# Patient Record
Sex: Female | Born: 1990 | Race: Black or African American | Hispanic: No | Marital: Single | Smoking: Never smoker
Health system: Southern US, Community
[De-identification: ages and names within clinical notes are randomized; demographics above are authoritative.]

## PROBLEM LIST (undated history)

## (undated) DIAGNOSIS — F419 Anxiety disorder, unspecified: Secondary | ICD-10-CM

## (undated) DIAGNOSIS — Z789 Other specified health status: Secondary | ICD-10-CM

## (undated) HISTORY — PX: WISDOM TOOTH EXTRACTION: SHX21

## (undated) HISTORY — PX: PILONIDAL CYST DRAINAGE: SHX743

## (undated) HISTORY — DX: Other specified health status: Z78.9

---
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
## 2010-04-27 ENCOUNTER — Emergency Department (HOSPITAL_COMMUNITY)
Admission: EM | Admit: 2010-04-27 | Discharge: 2010-04-27 | Payer: Self-pay | Source: Home / Self Care | Admitting: Emergency Medicine

## 2010-07-08 LAB — BASIC METABOLIC PANEL
BUN: 11 mg/dL (ref 6–23)
CO2: 24 mEq/L (ref 19–32)
Calcium: 9.4 mg/dL (ref 8.4–10.5)
Chloride: 103 mEq/L (ref 96–112)
Creatinine, Ser: 0.65 mg/dL (ref 0.4–1.2)
GFR calc Af Amer: >60 mL/min (ref >60)
GFR calc non Af Amer: >60 mL/min (ref >60)
Glucose, Bld: 114 mg/dL — ABNORMAL HIGH (ref 70–99)
Potassium: 3.5 mEq/L (ref 3.5–5.1)
Sodium: 137 mEq/L (ref 135–145)

## 2010-07-08 LAB — CBC
HCT: 36.2 % (ref 36.0–46.0)
Hemoglobin: 12.6 g/dL (ref 12.0–15.0)
MCH: 32.3 pg (ref 26.0–34.0)
MCHC: 34.8 g/dL (ref 30.0–36.0)
MCV: 92.8 fL (ref 78.0–100.0)
Platelets: 280 10*3/uL (ref 150–400)
RBC: 3.9 MIL/uL (ref 3.87–5.11)
RDW: 11.4 % — ABNORMAL LOW (ref 11.5–15.5)
WBC: 9.9 10*3/uL (ref 4.0–10.5)

## 2010-07-08 LAB — URINALYSIS, ROUTINE W REFLEX MICROSCOPIC
Bilirubin Urine: NEGATIVE
Glucose, UA: NEGATIVE mg/dL
Hgb urine dipstick: NEGATIVE
Ketones, ur: NEGATIVE mg/dL
Nitrite: NEGATIVE
Protein, ur: NEGATIVE mg/dL
Specific Gravity, Urine: 1.02 (ref 1.005–1.030)
Urobilinogen, UA: 0.2 mg/dL (ref 0.0–1.0)
pH: 7 (ref 5.0–8.0)

## 2010-07-08 LAB — HEPATIC FUNCTION PANEL
ALT: 27 U/L (ref 0–35)
AST: 33 U/L (ref 0–37)
Albumin: 4.4 g/dL (ref 3.5–5.2)
Alkaline Phosphatase: 42 U/L (ref 39–117)
Bilirubin, Direct: 0.2 mg/dL (ref 0.0–0.3)
Indirect Bilirubin: 0.6 mg/dL (ref 0.3–0.9)
Total Bilirubin: 0.8 mg/dL (ref 0.3–1.2)
Total Protein: 7.5 g/dL (ref 6.0–8.3)

## 2010-07-08 LAB — PREGNANCY, URINE: Preg Test, Ur: NEGATIVE

## 2010-07-08 LAB — DIFFERENTIAL
Basophils Absolute: 0 10*3/uL (ref 0.0–0.1)
Basophils Relative: 0 % (ref 0–1)
Eosinophils Relative: 0 % (ref 0–5)
Monocytes Absolute: 0.4 10*3/uL (ref 0.1–1.0)
Neutro Abs: 9 10*3/uL — ABNORMAL HIGH (ref 1.7–7.7)

## 2015-01-09 ENCOUNTER — Encounter (HOSPITAL_COMMUNITY): Payer: Self-pay | Admitting: Emergency Medicine

## 2015-01-09 ENCOUNTER — Emergency Department (HOSPITAL_COMMUNITY)
Admission: EM | Admit: 2015-01-09 | Discharge: 2015-01-09 | Disposition: A | Discharge status: Home / Self Care | Payer: Self-pay | Attending: Emergency Medicine | Admitting: Emergency Medicine

## 2015-01-09 ENCOUNTER — Emergency Department (HOSPITAL_COMMUNITY): Payer: Self-pay

## 2015-01-09 DIAGNOSIS — R091 Pleurisy: Secondary | ICD-10-CM | POA: Insufficient documentation

## 2015-01-09 MED ORDER — NAPROXEN 500 MG PO TABS: 500.0000 mg | ORAL_TABLET | Freq: Two times a day (BID) | ORAL | Status: DC

## 2015-01-09 MED ORDER — IBUPROFEN 800 MG PO TABS
800.0000 mg | ORAL_TABLET | Freq: Once | ORAL | Status: AC
  Administered 2015-01-09: 800 mg via ORAL
  Filled 2015-01-09: qty 1

## 2015-01-09 NOTE — ED Provider Notes (Signed)
CSN: [id number]     Arrival date & time 01/09/15  1708 History   First MD Initiated Contact with Patient 01/09/15 1730     Chief Complaint  Patient presents with  . Chest Pain      HPI  Patient presents evaluation of right-sided chest pain. Present for the last 3-4 days. No trauma. No additional symptoms. Does not have cough. No shortness of breath, fever, left-sided pain. No GI complaints or symptoms. No past similar episodes. She is not a smoker. She is onno OCPS, hormones, birth control.  History reviewed. No pertinent past medical history. History reviewed. No pertinent past surgical history. History reviewed. No pertinent family history. Social History  Substance Use Topics  . Smoking status: Never Smoker   . Smokeless tobacco: Never Used  . Alcohol Use: Yes     Comment: occ   OB History    Gravida Para Term Preterm AB TAB SAB Ectopic Multiple Living       Review of Systems  Constitutional: Negative for fever, chills, diaphoresis, appetite change and fatigue.  HENT: Negative for mouth sores, sore throat and trouble swallowing.   Eyes: Negative for visual disturbance.  Respiratory: Negative for cough, chest tightness, shortness of breath and wheezing.   Cardiovascular: Positive for chest pain.  Gastrointestinal: Negative for nausea, vomiting, abdominal pain, diarrhea and abdominal distention.  Endocrine: Negative for polydipsia, polyphagia and polyuria.  Genitourinary: Negative for dysuria, frequency and hematuria.  Musculoskeletal: Negative for gait problem.  Skin: Negative for color change, pallor and rash.  Neurological: Negative for dizziness, syncope, light-headedness and headaches.  Hematological: Does not bruise/bleed easily.  Psychiatric/Behavioral: Negative for behavioral problems and confusion.      Allergies  Review of patient's allergies indicates no known allergies.  Home Medications   Prior to Admission medications   Medication  Sig Start Date End Date Taking? Authorizing Provider  naproxen (NAPROSYN) 500 MG tablet Take 1 tablet (500 mg total) by mouth 2 (two) times daily. 01/09/15   Rolland Porter, MD   BP 127/82 mmHg  Pulse 84  Temp(Src) 98.6 F (37 C) (Oral)  Resp 18  Ht  (1.575 m)  Wt 180 lb (81.647 kg)  BMI 32.91 kg/m2  SpO2 100%  LMP 12/21/2014 (Exact Date) Physical Exam  Constitutional: She is oriented to person, place, and time. She appears well-developed and well-nourished. No distress.  HENT:  Head: Normocephalic.  Eyes: Conjunctivae are normal. Pupils are equal, round, and reactive to light. No scleral icterus.  Neck: Normal range of motion. Neck supple. No thyromegaly present.  Cardiovascular: Normal rate and regular rhythm.  Exam reveals no gallop and no friction rub.   No murmur heard. Pulmonary/Chest: Effort normal and breath sounds normal. No respiratory distress. She has no wheezes. She has no rales.    Tenderness along the right parasternal border. Reproducible deep breathing and palpation. No rash or vesicles. Clear lungs.  Abdominal: Soft. Bowel sounds are normal. She exhibits no distension. There is no tenderness. There is no rebound.  Musculoskeletal: Normal range of motion.  Neurological: She is alert and oriented to person, place, and time.  Skin: Skin is warm and dry. No rash noted.  Psychiatric: She has a normal mood and affect. Her behavior is normal.    ED Course  Procedures (including critical care time) Labs Review Labs Reviewed - No data to display  Imaging Review Dg Chest 2 View  01/09/2015   CLINICAL  DATA:  Right-sided chest discomfort for 5 days. Worse with palpation.  EXAM: CHEST  2 VIEW  COMPARISON:  None.  FINDINGS: A mild pectus excavatum deformity. Midline trachea. Normal heart size and mediastinal contours.Sharp costophrenic angles. No pneumothorax. Clear lungs.  IMPRESSION: No active cardiopulmonary disease.   Electronically Signed   By: Jeronimo Greaves M.D.   On:  01/09/2015 18:20   I have personally reviewed and evaluated these images and lab results as part of my medical decision-making.   EKG Interpretation None      MDM   Final diagnoses:  Pleurisy    Normal chest x-ray. Plan is home, appetite laboratories, avoid excessive activity and exercise. Recheck any worsening symptoms.    Rolland Porter, MD 01/09/15 [phone]

## 2015-01-09 NOTE — ED Notes (Signed)
Pt states she has some Rt sided chest discomfort- Worst with palpation , movement , coughing-- Denies SOb or any other symptoms

## 2015-01-09 NOTE — Discharge Instructions (Signed)

## 2015-04-04 ENCOUNTER — Emergency Department (HOSPITAL_COMMUNITY): Payer: PRIVATE HEALTH INSURANCE

## 2015-04-04 ENCOUNTER — Emergency Department (HOSPITAL_COMMUNITY)
Admission: EM | Admit: 2015-04-04 | Discharge: 2015-04-05 | Disposition: A | Discharge status: Home / Self Care | Payer: PRIVATE HEALTH INSURANCE | Attending: Emergency Medicine | Admitting: Emergency Medicine

## 2015-04-04 ENCOUNTER — Encounter (HOSPITAL_COMMUNITY): Payer: Self-pay | Admitting: Emergency Medicine

## 2015-04-04 DIAGNOSIS — K625 Hemorrhage of anus and rectum: Secondary | ICD-10-CM | POA: Insufficient documentation

## 2015-04-04 DIAGNOSIS — Z3202 Encounter for pregnancy test, result negative: Secondary | ICD-10-CM | POA: Diagnosis not present

## 2015-04-04 LAB — CBC
HEMATOCRIT: 38.2 % (ref 36.0–46.0)
Hemoglobin: 12.4 g/dL (ref 12.0–15.0)
MCH: 31.2 pg (ref 26.0–34.0)
MCHC: 32.5 g/dL (ref 30.0–36.0)
MCV: 96 fL (ref 78.0–100.0)
PLATELETS: 347 10*3/uL (ref 150–400)
RBC: 3.98 MIL/uL (ref 3.87–5.11)
RDW: 11.5 % (ref 11.5–15.5)
WBC: 7.1 10*3/uL (ref 4.0–10.5)

## 2015-04-04 LAB — COMPREHENSIVE METABOLIC PANEL
ALT: 16 U/L (ref 14–54)
ANION GAP: 7 (ref 5–15)
AST: 23 U/L (ref 15–41)
Albumin: 4.1 g/dL (ref 3.5–5.0)
Alkaline Phosphatase: 45 U/L (ref 38–126)
BILIRUBIN TOTAL: 0.5 mg/dL (ref 0.3–1.2)
BUN: 7 mg/dL (ref 6–20)
CO2: 26 mmol/L (ref 22–32)
Calcium: 9.6 mg/dL (ref 8.9–10.3)
Chloride: 107 mmol/L (ref 101–111)
Creatinine, Ser: 0.71 mg/dL (ref 0.44–1.00)
Glucose, Bld: 107 mg/dL — ABNORMAL HIGH (ref 65–99)
POTASSIUM: 3.9 mmol/L (ref 3.5–5.1)
Sodium: 140 mmol/L (ref 135–145)
TOTAL PROTEIN: 7.6 g/dL (ref 6.5–8.1)

## 2015-04-04 LAB — I-STAT BETA HCG BLOOD, ED (MC, WL, AP ONLY): I-stat hCG, quantitative: <5 m[IU]/mL (ref <5)

## 2015-04-04 LAB — LIPASE, BLOOD: Lipase: 39 U/L (ref 11–51)

## 2015-04-04 MED ORDER — BARIUM SULFATE 2.1 % PO SUSP
ORAL | Status: AC
  Filled 2015-04-04: qty 1

## 2015-04-04 MED ORDER — IOHEXOL 300 MG/ML  SOLN
100.0000 mL | Freq: Once | INTRAMUSCULAR | Status: AC | PRN
  Administered 2015-04-04: 100 mL via INTRAVENOUS

## 2015-04-04 MED ORDER — SODIUM CHLORIDE 0.9 % IV SOLN: Freq: Once | INTRAVENOUS | Status: DC

## 2015-04-04 NOTE — ED Notes (Signed)
Pt sent here by PCP for having 3 episodes of bright red stools. Pt's hemoccult was positive for blood at PCP. Pt denies any abdomin pain or n/v.

## 2015-04-04 NOTE — ED Provider Notes (Signed)
CSN: [id number]     Arrival date & time 04/04/15  1657 History   First MD Initiated Contact with Patient 04/04/15 2102     Chief Complaint  Patient presents with  . Rectal Bleeding     (Consider location/radiation/quality/duration/timing/severity/associated sxs/prior Treatment) HPI Comments: This is a normally healthy female who presented to her PCP with 3 episodes of BRBPR with out Hx of same. Denies abdominal pain, Hemmorid   Patient is a 24 y.o. female presenting with hematochezia. The history is provided by the patient.  Rectal Bleeding Quality:  Bright red Duration:  1 day Timing:  Intermittent Progression:  Worsening Chronicity:  New Context: defecation and spontaneously   Context: not anal fissures, not anal penetration, not diarrhea, not hemorrhoids, not rectal injury and not rectal pain   Similar prior episodes: no   Relieved by:  Nothing Worsened by:  Nothing tried Associated symptoms: no abdominal pain, no dizziness and no fever   Risk factors: no anticoagulant use     History reviewed. No pertinent past medical history. History reviewed. No pertinent past surgical history. No family history on file. Social History  Substance Use Topics  . Smoking status: Never Smoker   . Smokeless tobacco: Never Used  . Alcohol Use: Yes     Comment: occ   OB History    Gravida Para Term Preterm AB TAB SAB Ectopic Multiple Living   0 0 0 0 0 0 0 0 0 0      Review of Systems  Constitutional: Negative for fever and chills.  Respiratory: Negative for shortness of breath.   Cardiovascular: Negative for chest pain.  Gastrointestinal: Positive for blood in stool, hematochezia and anal bleeding. Negative for abdominal pain, diarrhea, constipation and rectal pain.  Genitourinary: Negative for menstrual problem.  Neurological: Negative for dizziness.  All other systems reviewed and are negative.     Allergies  Review of patient's allergies indicates no known  allergies.  Home Medications   Prior to Admission medications   Medication Sig Start Date End Date Taking? Authorizing Provider  acetaminophen (TYLENOL) 500 MG tablet Take 1,000 mg by mouth every 6 (six) hours as needed for mild pain.   Yes Historical Provider, MD   BP 117/77 mmHg  Pulse 72  Temp(Src) 98.7 F (37.1 C) (Oral)  Resp 16  Ht 5\' 3"  (1.6 m)  Wt 84.823 kg  BMI 33.13 kg/m2  SpO2 100%  LMP 03/23/2015 Physical Exam  Constitutional: She appears well-developed and well-nourished.  HENT:  Head: Normocephalic.  Eyes: Pupils are equal, round, and reactive to light.  Neck: Normal range of motion.  Cardiovascular: Normal rate.   Pulmonary/Chest: Effort normal.  Abdominal: Soft. She exhibits no distension. There is no tenderness.  Musculoskeletal: Normal range of motion.  Neurological: She is alert.  Nursing note and vitals reviewed.   ED Course  Procedures (including critical care time) Labs Review Labs Reviewed  COMPREHENSIVE METABOLIC PANEL - Abnormal; Notable for the following:    Glucose, Bld 107 (*)    All other components within normal limits  LIPASE, BLOOD  CBC  I-STAT BETA HCG BLOOD, ED ([facility], [facility], [facility] ONLY)    Imaging Review Ct Abdomen Pelvis W Contrast  04/05/2015  CLINICAL DATA:  24 year old female with bright red blood in stool EXAM: CT ABDOMEN AND PELVIS WITH CONTRAST TECHNIQUE: Multidetector CT imaging of the abdomen and pelvis was performed using the standard protocol following bolus administration of intravenous contrast. CONTRAST:  100mL OMNIPAQUE IOHEXOL 300 MG/ML  SOLN  COMPARISON:  None. FINDINGS: The visualized lung bases are clear. No intra-abdominal free air or free fluid. The liver, gallbladder, pancreas, spleen, adrenal glands, kidneys, visualized ureters, and urinary bladder appear unremarkable. There is a 3.7 cm left uterine fibroid. The visualized ovaries are unremarkable. Moderate stool throughout the colon. No evidence of bowel obstruction or  inflammation. Normal appendix. The abdominal aorta and IVC appear unremarkable. No portal venous gas identified. There is no lymphadenopathy. The visualized osseous structures and soft tissues appear unremarkable. IMPRESSION: No acute intra-abdominal or pelvic pathology. Left uterine fibroid. Ultrasound may provide better evaluation of the pelvic structures. Electronically Signed   By: Elgie Collard M.D.   On: 04/05/2015 00:23   I have personally reviewed and evaluated these images and lab results as part of my medical decision-making.   EKG Interpretation None      MDM   Final diagnoses:  Rectal bleed         Earley Favor, NP 04/05/15 1610  Bethann Berkshire, MD 04/05/15 1556

## 2015-04-05 NOTE — ED Notes (Signed)
Pt stable, ambulatory, states understanding of discharge instructions 

## 2015-04-05 NOTE — Discharge Instructions (Signed)
Gastrointestinal Bleeding Gastrointestinal bleeding is bleeding somewhere along the path that food travels through the body (digestive tract). This path is anywhere between the mouth and the opening of the butt (anus). You may have blood in your throw up (vomit) or in your poop (stools). If there is a lot of bleeding, you may need to stay in the hospital. HOME CARE  Only take medicine as told by your doctor.  Eat foods with fiber such as whole grains, fruits, and vegetables. You can also try eating 1 to 3 prunes a day.  Drink enough fluids to keep your pee (urine) clear or pale yellow. GET HELP RIGHT AWAY IF:   Your bleeding gets worse.  You feel dizzy, weak, or you pass out (faint).  You have bad cramps in your back or belly (abdomen).  You have large blood clumps (clots) in your poop.  Your problems are getting worse. MAKE SURE YOU:   Understand these instructions.  Will watch your condition.  Will get help right away if you are not doing well or get worse.   This information is not intended to replace advice given to you by your health care provider. Make sure you discuss any questions you have with your health care provider.   Document Released: 01/22/2008 Document Revised: 03/31/2012 Document Reviewed: 10/02/2014 Elsevier Interactive Patient Education 2016 [employer] Tonight you were evaluated for rectal bleeding, your labs are stable.  Your abdominal CT scan reveals normal internal organs, you do have a uterine fibroid.  This will need to be followed up by her OB/GYN, but not the cause of your rectal bleeding.  Please monitor the bleeding carefully.  If you develop shortness of breath, rapid heart rate, weakness.  Please return for further evaluation

## 2015-04-11 ENCOUNTER — Encounter: Payer: Self-pay | Admitting: Gastroenterology

## 2015-05-02 ENCOUNTER — Ambulatory Visit (INDEPENDENT_AMBULATORY_CARE_PROVIDER_SITE_OTHER): Payer: PRIVATE HEALTH INSURANCE | Admitting: Nurse Practitioner

## 2015-05-02 ENCOUNTER — Encounter: Payer: Self-pay | Admitting: Nurse Practitioner

## 2015-05-02 ENCOUNTER — Other Ambulatory Visit: Payer: Self-pay

## 2015-05-02 VITALS — BP 112/77 | HR 64 | Temp 98.1°F | Ht 62.0 in | Wt 189.4 lb

## 2015-05-02 DIAGNOSIS — K625 Hemorrhage of anus and rectum: Secondary | ICD-10-CM | POA: Diagnosis not present

## 2015-05-02 DIAGNOSIS — R197 Diarrhea, unspecified: Secondary | ICD-10-CM | POA: Diagnosis not present

## 2015-05-02 MED ORDER — NA SULFATE-K SULFATE-MG SULF 17.5-3.13-1.6 GM/177ML PO SOLN: 1.0000 | ORAL | Status: DC

## 2015-05-02 NOTE — Assessment & Plan Note (Signed)
Patient with episodes of "upset stomach." On the identified trigger is greasy foods which she tries to avoid. Symptoms are intermittent with postprandial fecal urgency and diarrhea. Other times has a bowel movement which is normal, soft, no straining about every other day. We will have her avoid dairy products and we'll check blood work for tissue transglutaminase IgA and total IgA to evaluate for possible celiac disease. Colonoscopy as noted above. We'll bring her back in 6 weeks to further evaluate, consider workup for irritable bowel syndrome.

## 2015-05-02 NOTE — Patient Instructions (Addendum)
1. Have your blood work drawn when you're able to. 2. We will schedule your procedure for you. 3. Further recommendations to be based on the results of your procedure. 4. Return for follow-up in 6 weeks. 5. Avoid dairy products and see if this helps your symptoms.

## 2015-05-02 NOTE — Progress Notes (Signed)
cc'ed to pcp °

## 2015-05-02 NOTE — Assessment & Plan Note (Signed)
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 Hemoglobin and hematocrit were normal. CT abdomen and pelvis with no acute process. Otherwise asymptomatic from a GI standpoint other than "upset stomach "as discussed in history of present illness. At this point we'll proceed with a colonoscopy to further evaluate. She has no history of hemorrhoids. Most likely this is a benign anorectal source although cannot exclude more insidious pathology.  Proceed with colonoscopy with Dr. Darrick PennaFields in the near future. The risks, benefits, and alternatives have been discussed in detail with the patient. They state understanding and desire to proceed.   Patient is not on any anticoagulants, anxiolytics, chronic pain medications, or antidepressants. Conscious sedation should be adequate for her procedure.

## 2015-05-02 NOTE — Progress Notes (Signed)
Primary Care Physician:  Evlyn Courier, MD Primary Gastroenterologist:  Dr. Darrick Penna  Chief Complaint  Patient presents with  . Rectal Bleeding  . set up TCS    HPI:   25 year old female referred by primary care and emergency room for rectal bleeding. PCP notes reviewed. Last seen by PCP on 04/04/2015 when she complained of passing bright red blood via her rectum for 3 episodes on 04/04/2015. Was otherwise asymptomatic from a GI standpoint. Was having increased fatigue, nausea, and bloating. Blood loss was approximately 3 tablespoons to a half cup per episode. Negative history for hemorrhoids or inflammatory bowel disease. She was subsequently referred to the emergency room for further evaluation. Option normal kidney and liver function, normal hemoglobin/hematocrit at 12.4/38.2. CT abdomen and pelvis with contrast showed no acute intra-abdominal or pelvic pathology, left uterine fibroid likely better evaluated by ultrasound. Recommended follow-up with GI.  Today she states she has had no bleeding since. Does not have hemorrhoids that she's aware of. Denies constipation. Has "upset stomach" history where she'll eat and have fecal urgency and diarrhea. Symptoms are intermittent and will come and go with no known trigger. Otherwise has a bowel movement every other day. Denies straining. Has abdominal pain with her episodes of upset stomach, abdominal pain improves with having a bowel movement. Greasy foods tend to exacerbate symptoms, which she "mostly" avoids. No other triggers identified. Denies fever, chills, N/V, unintentional weight loss, GERD symptoms. Denies chest pain, dyspnea, dizziness, lightheadedness, syncope, near syncope. Denies any other upper or lower GI symptoms.   Past Medical History  Diagnosis Date  . Medical history non-contributory     No know PMH to date 05/02/15    Past Surgical History  Procedure Laterality Date  . Wisdom tooth extraction      Current Outpatient  Prescriptions  Medication Sig Dispense Refill  . acetaminophen (TYLENOL) 500 MG tablet Take 1,000 mg by mouth every 6 (six) hours as needed for mild pain.     No current facility-administered medications for this visit.    Allergies as of 05/02/2015  . (No Known Allergies)    Family History  Problem Relation Age of Onset  . Colon cancer Neg Hx   . Inflammatory bowel disease Neg Hx     Social History   Social History  . Marital Status: Single    Spouse Name: N/A  . Number of Children: N/A  . Years of Education: N/A   Occupational History  . Not on file.   Social History Main Topics  . Smoking status: Never Smoker   . Smokeless tobacco: Never Used  . Alcohol Use: 0.0 oz/week    0 Standard drinks or equivalent per week     Comment: Occasionally typically every 2-4 weeks, 1-2 drinks per sitting.  . Drug Use: No  . Sexual Activity: Yes    Birth Control/ Protection: Condom   Other Topics Concern  . Not on file   Social History Narrative    Review of Systems: 10-point ROS negative except as per HPI.    Physical Exam: BP 112/77 mmHg  Pulse 64  Temp(Src) 98.1 F (36.7 C)  Ht 5\' 2"  (1.575 m)  Wt 189 lb 6.4 oz (85.911 kg)  BMI 34.63 kg/m2  LMP 04/26/2015 (Approximate) General:   Alert and oriented. Pleasant and cooperative. Well-nourished and well-developed.  Head:  Normocephalic and atraumatic. Eyes:  Without icterus, sclera clear and conjunctiva pink.  Ears:  Normal auditory acuity. Cardiovascular:  S1, S2 present without  murmurs appreciated. Normal bilateral DP pulses noted. Extremities without clubbing or edema. Respiratory:  Clear to auscultation bilaterally. No wheezes, rales, or rhonchi. No distress.  Gastrointestinal:  +BS, soft, non-tender and non-distended. No HSM noted. No guarding or rebound. No masses appreciated.  Rectal:  Deferred  Skin:  Intact without significant lesions or rashes. Neurologic:  Alert and oriented x4;  grossly normal  neurologically. Psych:  Alert and cooperative. Normal mood and affect.    05/02/2015 9:24 AM

## 2015-05-07 LAB — IGA: IgA: 336 mg/dL (ref 69–380)

## 2015-05-08 LAB — TISSUE TRANSGLUTAMINASE, IGA: Tissue Transglutaminase Ab, IgA: 1 U/mL (ref <4)

## 2015-05-25 ENCOUNTER — Encounter (HOSPITAL_COMMUNITY): Payer: Self-pay | Admitting: *Deleted

## 2015-05-25 ENCOUNTER — Telehealth: Payer: Self-pay | Admitting: Gastroenterology

## 2015-05-25 ENCOUNTER — Ambulatory Visit (HOSPITAL_COMMUNITY)
Admission: RE | Admit: 2015-05-25 | Discharge: 2015-05-25 | Disposition: A | Discharge status: Home / Self Care | Payer: PRIVATE HEALTH INSURANCE | Source: Ambulatory Visit | Attending: Gastroenterology | Admitting: Gastroenterology

## 2015-05-25 ENCOUNTER — Encounter (HOSPITAL_COMMUNITY)
Admission: RE | Disposition: A | Discharge status: Home / Self Care | Payer: Self-pay | Source: Ambulatory Visit | Attending: Gastroenterology

## 2015-05-25 DIAGNOSIS — R197 Diarrhea, unspecified: Secondary | ICD-10-CM | POA: Diagnosis not present

## 2015-05-25 DIAGNOSIS — K625 Hemorrhage of anus and rectum: Secondary | ICD-10-CM

## 2015-05-25 DIAGNOSIS — K921 Melena: Secondary | ICD-10-CM | POA: Insufficient documentation

## 2015-05-25 DIAGNOSIS — K648 Other hemorrhoids: Secondary | ICD-10-CM | POA: Diagnosis not present

## 2015-05-25 HISTORY — PX: COLONOSCOPY: SHX5424

## 2015-05-25 SURGERY — COLONOSCOPY
Anesthesia: Moderate Sedation

## 2015-05-25 MED ORDER — MEPERIDINE HCL 100 MG/ML IJ SOLN
INTRAMUSCULAR | Status: DC | PRN
  Administered 2015-05-25 (×2): 50 mg via INTRAVENOUS

## 2015-05-25 MED ORDER — MIDAZOLAM HCL 5 MG/5ML IJ SOLN
INTRAMUSCULAR | Status: AC
  Filled 2015-05-25: qty 10

## 2015-05-25 MED ORDER — MEPERIDINE HCL 100 MG/ML IJ SOLN
INTRAMUSCULAR | Status: AC
  Filled 2015-05-25: qty 2

## 2015-05-25 MED ORDER — SODIUM CHLORIDE 0.9 % IV SOLN
INTRAVENOUS | Status: DC
  Administered 2015-05-25: 1000 mL via INTRAVENOUS

## 2015-05-25 MED ORDER — MIDAZOLAM HCL 5 MG/5ML IJ SOLN
INTRAMUSCULAR | Status: DC | PRN
  Administered 2015-05-25 (×2): 2 mg via INTRAVENOUS
  Administered 2015-05-25: 1 mg via INTRAVENOUS

## 2015-05-25 MED ORDER — STERILE WATER FOR IRRIGATION IR SOLN
Status: DC | PRN
  Administered 2015-05-25: 2.5 mL

## 2015-05-25 NOTE — Op Note (Signed)
[facility] [address] [place], [address]   COLONOSCOPY PROCEDURE REPORT  PATIENT: Krista Leach, Krista Leach  MR#: [id number] BIRTHDATE: 02-03-1991 , 25  yrs. old GENDER: female ENDOSCOPIST: West Bali, MD REFERRED OZ:[id number] Hill, M.D. PROCEDURE DATE:  06/21/15 PROCEDURE:   Colonoscopy, diagnostic INDICATIONS:unexplained diarrhea and hematochezia-HEAVY x1.  DEC 2016 Hb 12.4 Cr 3.9 PLT CT 347 MEDICATIONS: Demerol 100 mg IV and Versed 5 mg IV  DESCRIPTION OF PROCEDURE:    Physical exam was performed.  Informed consent was obtained from the patient after explaining the benefits, risks, and alternatives to procedure.  The patient was connected to monitor and placed in left lateral position. Continuous oxygen was provided by nasal cannula and IV medicine administered through an indwelling cannula.  After administration of sedation and rectal exam, the patients rectum was intubated and the EC-[id number] ([id number])  colonoscope was advanced under direct visualization to the ileum.  The scope was removed slowly by carefully examining the color, texture, anatomy, and integrity mucosa on the way out.  The patient was recovered in endoscopy and discharged home in satisfactory condition. Estimated blood loss is zero unless otherwise noted in this procedure report.    COLON FINDINGS: The examined terminal ileum appeared to be normal. , The colonic mucosa appeared normal throughout the entire examined colon.  Multiple biopsies were performed using cold forceps.  , and Small internal hemorrhoids were found.  PREP QUALITY: excellent.  CECAL W/D TIME: 12       minutes COMPLICATIONS: None  ENDOSCOPIC IMPRESSION: 1.   RECTAL BLEEDING MOST LIKELY DUE TO HEMORRHOIDS 2.   NORMAL ILEUM AMD COLON  RECOMMENDATIONS: SEE DR.  Lovell Sheehan TO HAVE ANOSCOPY . DRINK WATER TO KEEP URINE LIGHT YELLOW. FOLLOW A HIGH FIBER DIET. USE PREPARATION H 2 TO 4 TIMES A DAY FOR 10 DAYS TO RELIEVE  RECTAL PRESSURE/PAIN/BLEEDING/ITCHING. Next colonoscopy AT AGE 58.      _______________________________ eSignedWest Bali, MD Jun 21, 2015 11:29 AM   CPT CODES: ICD CODES:  The ICD and CPT codes recommended by this software are interpretations from the data that the clinical staff has captured with the software.  The verification of the translation of this report to the ICD and CPT codes and modifiers is the sole responsibility of the health care institution and practicing physician where this report was generated.  [employer] will not be held responsible for the validity of the ICD and CPT codes included on this report.  AMA assumes no liability for data contained or not contained herein. CPT is a Publishing rights manager of the [employer].

## 2015-05-25 NOTE — H&P (Signed)
  Primary Care Physician:  Maggie Font, MD Primary Gastroenterologist:  Dr. Oneida Alar  Pre-Procedure History & Physical: HPI:  Krista Leach is a 25 y.o. female here for DIARRHEA/RECTAL BLEEDING.  Past Medical History  Diagnosis Date  . Medical history non-contributory     No know PMH to date 05/02/15    Past Surgical History  Procedure Laterality Date  . Wisdom tooth extraction      Prior to Admission medications   Medication Sig Start Date End Date Taking? Authorizing Provider  acetaminophen (TYLENOL) 500 MG tablet Take 1,000 mg by mouth every 6 (six) hours as needed for mild pain.   Yes Historical Provider, MD  Na Sulfate-K Sulfate-Mg Sulf (SUPREP BOWEL PREP) SOLN Take 1 kit by mouth as directed. 05/02/15  Yes Danie Binder, MD    Allergies as of 05/02/2015  . (No Known Allergies)    Family History  Problem Relation Age of Onset  . Colon cancer Neg Hx   . Inflammatory bowel disease Neg Hx   . Hypertension Mother   . Diabetes Mother     Social History   Social History  . Marital Status: Single    Spouse Name: N/A  . Number of Children: N/A  . Years of Education: N/A   Occupational History  . Not on file.   Social History Main Topics  . Smoking status: Never Smoker   . Smokeless tobacco: Never Used  . Alcohol Use: 0.0 oz/week    0 Standard drinks or equivalent per week     Comment: Occasionally typically every 2-4 weeks, 1-2 drinks per sitting.  . Drug Use: No  . Sexual Activity: Yes    Birth Control/ Protection: Condom   Other Topics Concern  . Not on file   Social History Narrative    Review of Systems: See HPI, otherwise negative ROS   Physical Exam: BP 126/74 mmHg  Pulse 73  Temp(Src) 98.4 F (36.9 C) (Oral)  Resp 11  Ht '5\' 3"'$  (1.6 m)  Wt 189 lb (85.73 kg)  BMI 33.49 kg/m2  SpO2 99%  LMP 05/22/2015 (Exact Date) General:   Alert,  pleasant and cooperative in NAD Head:  Normocephalic and atraumatic. Neck:  Supple; Lungs:  Clear  throughout to auscultation.    Heart:  Regular rate and rhythm. Abdomen:  Soft, nontender and nondistended. Normal bowel sounds, without guarding, and without rebound.   Neurologic:  Alert and  oriented x4;  grossly normal neurologically.  Impression/Plan:    Diarrhea/RECTAL BLEEDING  PLAN: TCS TODAY WITH BIOPSY

## 2015-05-25 NOTE — Progress Notes (Signed)
REVIEWED-NO ADDITIONAL RECOMMENDATIONS. 

## 2015-05-25 NOTE — Discharge Instructions (Signed)
You have internal hemorrhoids, WHICH CAN CAUSE RECTAL BLEEDING. YOU DID NOT HAVE ANY POLYPS.   YOU SHOULD SEE DR. Lovell Sheehan TO HAVE ANOSCOPY TO SEE IF YOU COULD HAVE A LESION IN YOUR ANAL CANAL THAT MAY HAVE BLED.  DRINK WATER TO KEEP YOUR URINE LIGHT YELLOW.  FOLLOW A HIGH FIBER DIET. AVOID ITEMS THAT CAUSE BLOATING. SEE INFO BELOW.  USE PREPARATION H 2 TO 4 TIMES A DAY FOR 10 DAYS TO RELIEVE RECTAL PRESSURE/PAIN/BLEEDING/ITCHING.  YOUR BIOPSY RESULTS WILL BE AVAILABLE IN MY CHART JAN 31 AND MY OFFICE WILL CONTACT YOU IN 10-14 DAYS WITH YOUR RESULTS.   Next colonoscopy AT AGE 25.  Colonoscopy Care After Read the instructions outlined below and refer to this sheet in the next week. These discharge instructions provide you with general information on caring for yourself after you leave the hospital. While your treatment has been planned according to the most current medical practices available, unavoidable complications occasionally occur. If you have any problems or questions after discharge, call DR. Echo Allsbrook, [phone].  ACTIVITY  You may resume your regular activity, but move at a slower pace for the next 24 hours.   Take frequent rest periods for the next 24 hours.   Walking will help get rid of the air and reduce the bloated feeling in your belly (abdomen).   No driving for 24 hours (because of the medicine (anesthesia) used during the test).   You may shower.   Do not sign any important legal documents or operate any machinery for 24 hours (because of the anesthesia used during the test).    NUTRITION  Drink plenty of fluids.   You may resume your normal diet as instructed by your doctor.   Begin with a light meal and progress to your normal diet. Heavy or fried foods are harder to digest and may make you feel sick to your stomach (nauseated).   Avoid alcoholic beverages for 24 hours or as instructed.    MEDICATIONS  You may resume your normal  medications.   WHAT YOU CAN EXPECT TODAY  Some feelings of bloating in the abdomen.   Passage of more gas than usual.   Spotting of blood in your stool or on the toilet paper  .  IF YOU HAD POLYPS REMOVED DURING THE COLONOSCOPY:  Eat a soft diet IF YOU HAVE NAUSEA, BLOATING, ABDOMINAL PAIN, OR VOMITING.    FINDING OUT THE RESULTS OF YOUR TEST Not all test results are available during your visit. DR. Darrick Penna WILL CALL YOU WITHIN 7 DAYS OF YOUR PROCEDUE WITH YOUR RESULTS. Do not assume everything is normal if you have not heard from DR. Sholonda Jobst IN ONE WEEK, CALL HER OFFICE AT [phone].  SEEK IMMEDIATE MEDICAL ATTENTION AND CALL THE OFFICE: [phone] IF:  You have more than a spotting of blood in your stool.   Your belly is swollen (abdominal distention).   You are nauseated or vomiting.   You have a temperature over 101F.   You have abdominal pain or discomfort that is severe or gets worse throughout the day.  High-Fiber Diet A high-fiber diet changes your normal diet to include more whole grains, legumes, fruits, and vegetables. Changes in the diet involve replacing refined carbohydrates with unrefined foods. The calorie level of the diet is essentially unchanged. The Dietary Reference Intake (recommended amount) for adult males is 38 grams per day. For adult females, it is 25 grams per day. Pregnant and lactating women should consume 28 grams of fiber  per day. Fiber is the intact part of a plant that is not broken down during digestion. Functional fiber is fiber that has been isolated from the plant to provide a beneficial effect in the body. PURPOSE  Increase stool bulk.   Ease and regulate bowel movements.   Lower cholesterol.  REDUCE RISK OF COLON CANCER  INDICATIONS THAT YOU NEED MORE FIBER  Constipation and hemorrhoids.   Uncomplicated diverticulosis (intestine condition) and irritable bowel syndrome.   Weight management.   As a protective measure against  hardening of the arteries (atherosclerosis), diabetes, and cancer.   GUIDELINES FOR INCREASING FIBER IN THE DIET  Start adding fiber to the diet slowly. A gradual increase of about 5 more grams (2 slices of whole-wheat bread, 2 servings of most fruits or vegetables, or 1 bowl of high-fiber cereal) per day is best. Too rapid an increase in fiber may result in constipation, flatulence, and bloating.   Drink enough water and fluids to keep your urine clear or pale yellow. Water, juice, or caffeine-free drinks are recommended. Not drinking enough fluid may cause constipation.   Eat a variety of high-fiber foods rather than one type of fiber.   Try to increase your intake of fiber through using high-fiber foods rather than fiber pills or supplements that contain small amounts of fiber.   The goal is to change the types of food eaten. Do not supplement your present diet with high-fiber foods, but replace foods in your present diet.   INCLUDE A VARIETY OF FIBER SOURCES  Replace refined and processed grains with whole grains, canned fruits with fresh fruits, and incorporate other fiber sources. White rice, white breads, and most bakery goods contain little or no fiber.   Brown whole-grain rice, buckwheat oats, and many fruits and vegetables are all good sources of fiber. These include: broccoli, Brussels sprouts, cabbage, cauliflower, beets, sweet potatoes, white potatoes (skin on), carrots, tomatoes, eggplant, squash, berries, fresh fruits, and dried fruits.   Cereals appear to be the richest source of fiber. Cereal fiber is found in whole grains and bran. Bran is the fiber-rich outer coat of cereal grain, which is largely removed in refining. In whole-grain cereals, the bran remains. In breakfast cereals, the largest amount of fiber is found in those with "bran" in their names. The fiber content is sometimes indicated on the label.   You may need to include additional fruits and vegetables each day.    In baking, for 1 cup white flour, you may use the following substitutions:   1 cup whole-wheat flour minus 2 tablespoons.   1/2 cup white flour plus 1/2 cup whole-wheat flour.   Hemorrhoids Hemorrhoids are dilated (enlarged) veins around the rectum. Sometimes clots will form in the veins. This makes them swollen and painful. These are called thrombosed hemorrhoids. Causes of hemorrhoids include:  Constipation.   Straining to have a bowel movement.   HEAVY LIFTING  HOME CARE INSTRUCTIONS  Eat a well balanced diet and drink 6 to 8 glasses of water every day to avoid constipation. You may also use a bulk laxative.   Avoid straining to have bowel movements.   Keep anal area dry and clean.   Do not use a donut shaped pillow or sit on the toilet for long periods. This increases blood pooling and pain.   Move your bowels when your body has the urge; this will require less straining and will decrease pain and pressure.

## 2015-05-25 NOTE — Telephone Encounter (Signed)
REFER TO DR. Lovell Sheehan TO HAVE ANOSCOPY FOR RECTAL BLEEDING.

## 2015-05-28 ENCOUNTER — Other Ambulatory Visit: Payer: Self-pay

## 2015-05-28 ENCOUNTER — Encounter (HOSPITAL_COMMUNITY): Payer: Self-pay | Admitting: Gastroenterology

## 2015-05-28 DIAGNOSIS — K625 Hemorrhage of anus and rectum: Secondary | ICD-10-CM

## 2015-05-28 NOTE — Telephone Encounter (Signed)
Referral sent to [facility]

## 2015-06-11 ENCOUNTER — Telehealth: Payer: Self-pay | Admitting: Gastroenterology

## 2015-06-11 NOTE — Telephone Encounter (Signed)
Please call pt. Her colon biopsies are normal.   SEE DR. Lovell Sheehan TO HAVE ANOSCOPY TO SEE IF YOU HAVE A LESION IN YOUR ANAL CANAL THAT MAY HAVE BLED.  DRINK WATER TO KEEP YOUR URINE LIGHT YELLOW.  FOLLOW A HIGH FIBER DIET. AVOID ITEMS THAT CAUSE BLOATING. SEE INFO BELOW.  USE PREPARATION H 2 TO 4 TIMES A DAY FOR 10 DAYS TO RELIEVE RECTAL PRESSURE/PAIN/BLEEDING/ITCHING.  Next colonoscopy AT AGE 25.

## 2015-06-12 NOTE — Telephone Encounter (Signed)
LMOM to call.

## 2015-06-12 NOTE — Telephone Encounter (Signed)
Reminder in epic °

## 2015-06-13 ENCOUNTER — Ambulatory Visit (INDEPENDENT_AMBULATORY_CARE_PROVIDER_SITE_OTHER): Payer: PRIVATE HEALTH INSURANCE | Admitting: Nurse Practitioner

## 2015-06-13 ENCOUNTER — Encounter: Payer: Self-pay | Admitting: Nurse Practitioner

## 2015-06-13 VITALS — BP 110/70 | HR 64 | Temp 97.1°F | Ht 63.0 in | Wt 190.4 lb

## 2015-06-13 DIAGNOSIS — R197 Diarrhea, unspecified: Secondary | ICD-10-CM

## 2015-06-13 DIAGNOSIS — K625 Hemorrhage of anus and rectum: Secondary | ICD-10-CM

## 2015-06-13 MED ORDER — DICYCLOMINE HCL 10 MG PO CAPS: 10.0000 mg | ORAL_CAPSULE | Freq: Three times a day (TID) | ORAL | Status: DC

## 2015-06-13 NOTE — Assessment & Plan Note (Signed)
Continues with intermittent diarrhea and fecal urgency. States she isn't really having watery diarrhea that it is more soft/mushy stools. There is a component of some abdominal pain typically which resolves with bowel movements. States she was diagnosed with IBS years ago. Is wanting to try something to prevent public embarrassment related to her upset stomach. This point we will try Bentyl 10 mg 3 times a day with meals and in the evening as needed. Return for follow-up in 3 months.

## 2015-06-13 NOTE — Telephone Encounter (Signed)
Pt is aware. OK to make the referral to Dr. Lovell Sheehan.

## 2015-06-13 NOTE — Progress Notes (Signed)
cc'ed to pcp °

## 2015-06-13 NOTE — Assessment & Plan Note (Addendum)
Rectal bleeding deemed likely due to internal hemorrhoids per colonoscopy. No further bleeding since previous visit. Follow-up as needed. Recommend surgical consult for anoscopy if further issues. She states she is not wanting to pursue that avenue at this time but she will notify us she does in the future.

## 2015-06-13 NOTE — Patient Instructions (Signed)
1. I sent a prescription for Bentyl to your pharmacy. Take a 10 mg pill 3 times a day with meals and in the evening before bed. 2. You can titrate this based on results and when he typically have your symptoms. 3. Return for follow-up in 3 months. 4. If at some point in the future he decide to pursue surgical evaluation for your hemorrhoids let us know and we can send a referral to a surgeon for you.

## 2015-06-13 NOTE — Progress Notes (Addendum)
Referring Provider: Mirna Mires, MD Primary Care Physician:  Evlyn Courier, MD Primary GI:  Dr. Darrick Penna  Chief Complaint  Patient presents with  . Follow-up    HPI:   Krista Leach is a 25 y.o. female who presents for follow-up on diarrhea, rectal bleeding, status post colonoscopy. She was last seen in our office 05/02/2015 for rectal bleeding and diarrhea and at that time she described a 3 day episode of passing bright red blood per rectum, otherwise asymptomatic. At time of visit she states she had no bleeding since. Also described intermittent episodes of fecal urgency and diarrhea postprandial which tends to be worse with greasy foods. At that time labs showed a normal H/H and CT abdomen and pelvis with no acute process. Recommended she avoid dairy products, checked blood work for tissue transglutaminase IgA and total IgA for possible celiac disease, referred for colonoscopy. Her laboratory normal that day.  Colonoscopy completed on 05/25/2015 which found rectal bleeding was likely due to hemorrhoids, normal ileum and colon. Recommended she see Dr. Lovell Sheehan to have an anoscopy, use Preparation H for symptomatic hemorrhoids, next colonoscopy at age 7. Random colon biopsy of the right ascending colon found benign colonic mucosa.  Today she states she has not had any further bleeding. She is still having intermittent diarrhea ("soft/mushy, not liquid/watery") with "upset stomach." Upset stomach includes fecal urgency. Occasional abdominal pain, predominantly soft/mushy stools, pain improves with bowel movement. Somewhat avoids greasy food triggers. Eats minimal to no dairy food. Denies hematochezia, melena, N/V, unintentional weight loss, fever, chills. States she was on "a little blue pill" she took every evening and before a meal for irritable bowel. Denies chest pain, dyspnea, dizziness, lightheadedness, syncope, near syncope. Denies any other upper or lower GI symptoms.  Past Medical  History  Diagnosis Date  . Medical history non-contributory     No know PMH to date 05/02/15    Past Surgical History  Procedure Laterality Date  . Wisdom tooth extraction    . Colonoscopy N/A 05/25/2015    Procedure: COLONOSCOPY;  Surgeon: West Bali, MD;  Location: [facility];  Service: Endoscopy;  Laterality: N/A;  1015    Current Outpatient Prescriptions  Medication Sig Dispense Refill  . acetaminophen (TYLENOL) 500 MG tablet Take 1,000 mg by mouth every 6 (six) hours as needed for mild pain.     No current facility-administered medications for this visit.    Allergies as of 06/13/2015  . (No Known Allergies)    Family History  Problem Relation Age of Onset  . Colon cancer Neg Hx   . Inflammatory bowel disease Neg Hx   . Hypertension Mother   . Diabetes Mother     Social History   Social History  . Marital Status: Single    Spouse Name: N/A  . Number of Children: N/A  . Years of Education: N/A   Social History Main Topics  . Smoking status: Never Smoker   . Smokeless tobacco: Never Used  . Alcohol Use: 0.0 oz/week    0 Standard drinks or equivalent per week     Comment: Occasionally typically every 2-4 weeks, 1-2 drinks per sitting.  . Drug Use: No  . Sexual Activity: Yes    Birth Control/ Protection: Condom   Other Topics Concern  . None   Social History Narrative    Review of Systems: 10-point ROS negative except as per HPI.   Physical Exam: BP 110/70 mmHg  Pulse 64  Temp(Src) 97.1 F (36.2 C)  Ht  (1.6 m)  Wt 190 lb 6.4 oz (86.365 kg)  BMI 33.74 kg/m2  LMP 05/22/2015 (Exact Date) General:   Alert and oriented. Pleasant and cooperative. Well-nourished and well-developed.  Head:  Normocephalic and atraumatic. Cardiovascular:  S1, S2 present without murmurs appreciated. Extremities without clubbing or edema. Respiratory:  Clear to auscultation bilaterally. No wheezes, rales, or rhonchi. No distress.  Gastrointestinal:  +BS, soft,  non-tender and non-distended. No HSM noted. No guarding or rebound. No masses appreciated.  Rectal:  Deferred  Neurologic:  Alert and oriented x4;  grossly normal neurologically. Psych:  Alert and cooperative. Normal mood and affect.    06/13/2015 8:54 AM

## 2015-06-14 NOTE — Telephone Encounter (Signed)
Referral has been made.

## 2015-09-10 ENCOUNTER — Encounter: Payer: Self-pay | Admitting: Nurse Practitioner

## 2015-09-10 ENCOUNTER — Ambulatory Visit: Payer: PRIVATE HEALTH INSURANCE | Admitting: Nurse Practitioner

## 2015-09-10 ENCOUNTER — Telehealth: Payer: Self-pay | Admitting: Nurse Practitioner

## 2015-09-10 NOTE — Telephone Encounter (Signed)
PT WAS A NO SHOW AND LETTER SENT  °

## 2015-09-10 NOTE — Telephone Encounter (Signed)
Noted  

## 2015-11-21 ENCOUNTER — Ambulatory Visit: Payer: PRIVATE HEALTH INSURANCE | Admitting: Nurse Practitioner

## 2015-11-21 ENCOUNTER — Telehealth: Payer: Self-pay | Admitting: Nurse Practitioner

## 2015-11-21 ENCOUNTER — Encounter: Payer: Self-pay | Admitting: Nurse Practitioner

## 2015-11-21 NOTE — Telephone Encounter (Signed)
PT WAS A NO SHOW AND LETTER SENT  °

## 2015-11-22 NOTE — Telephone Encounter (Signed)
Noted  

## 2015-11-23 ENCOUNTER — Telehealth: Payer: Self-pay | Admitting: Gastroenterology

## 2015-11-23 ENCOUNTER — Ambulatory Visit: Payer: PRIVATE HEALTH INSURANCE | Admitting: Gastroenterology

## 2015-11-23 NOTE — Telephone Encounter (Signed)
Pt was late for her OV earlier this week and was rescheduled for today and she was a NO SHOW

## 2017-08-31 IMAGING — CT CT ABD-PELV W/ CM
2 of 4 series · 16 of 46 positions shown, 18 images · IV contrast (omnipaque)
Comparison: None.

CLINICAL DATA: 24-year-old female with bright red blood in stool

EXAM:
CT ABDOMEN AND PELVIS WITH CONTRAST
TECHNIQUE: Multidetector CT imaging of the abdomen and pelvis was performed
using the standard protocol following bolus administration of
intravenous contrast.
CONTRAST:  100mL OMNIPAQUE IOHEXOL 300 MG/ML  SOLN

[Series 2: abd/ pelvis 5.0 i30f 1 · axial · 0.68mm/px · z∈[-462,-17]mm · 13 of 99 slices shown, 15 images]
[im 5/99  soft-tissue]
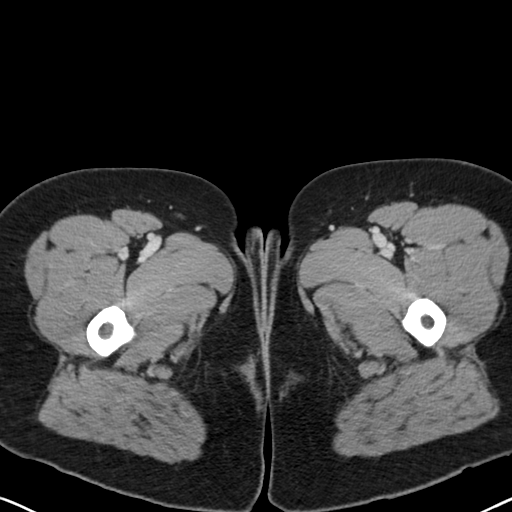
[im 5/99  bone]
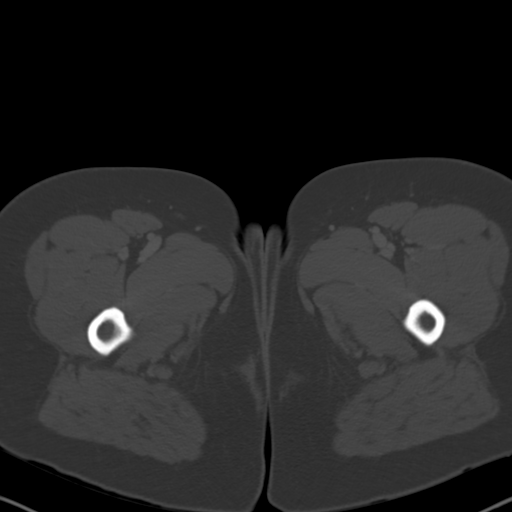
[im 13/99  soft-tissue]
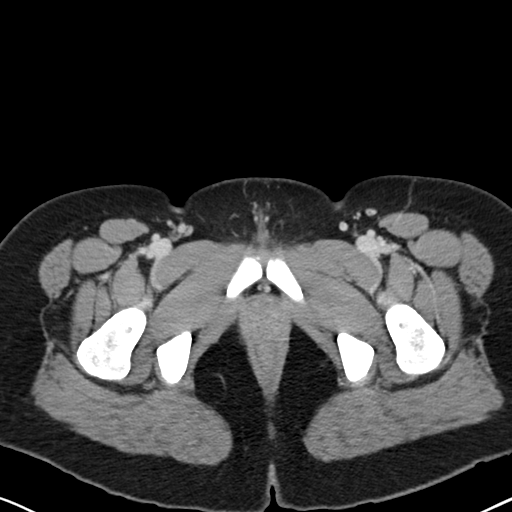
[im 21/99  soft-tissue]
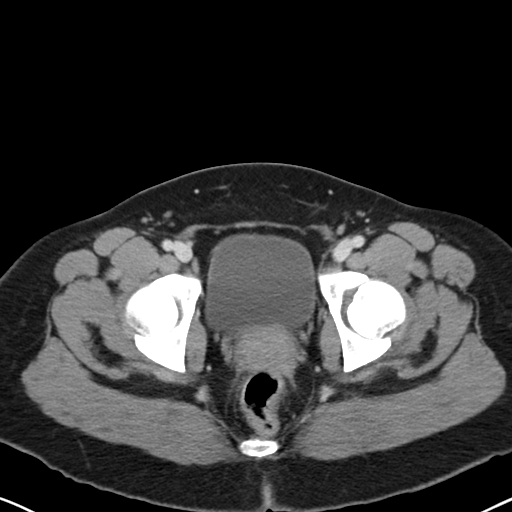
[im 29/99  soft-tissue]
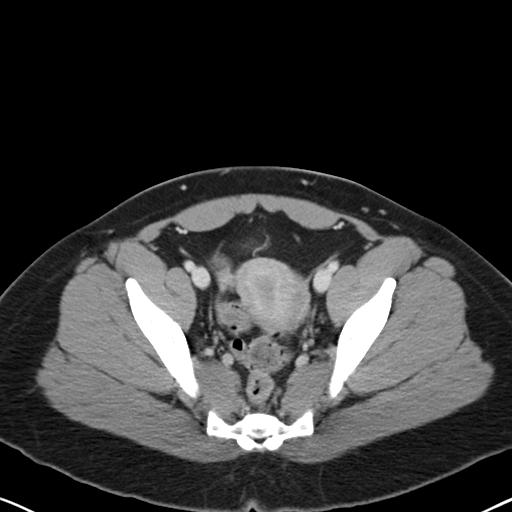
[im 33/99  soft-tissue]
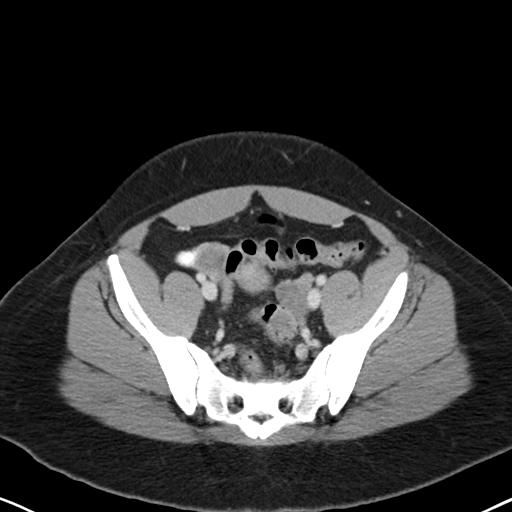
[im 41/99  soft-tissue]
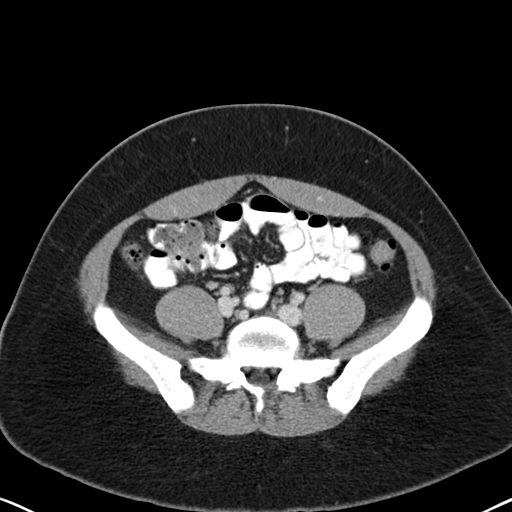
[im 50/99  soft-tissue]
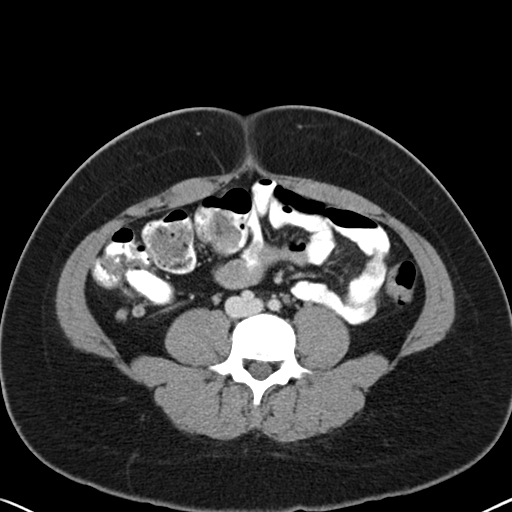
[im 58/99  soft-tissue]
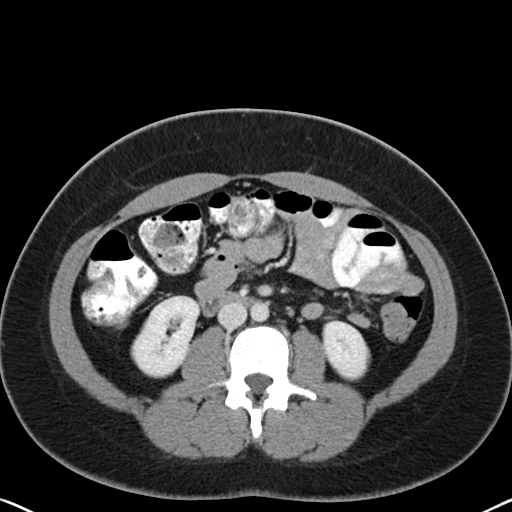
[im 66/99  soft-tissue]
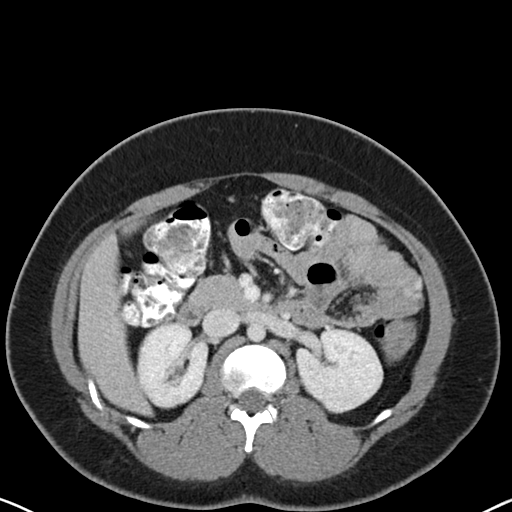
[im 66/99  bone]
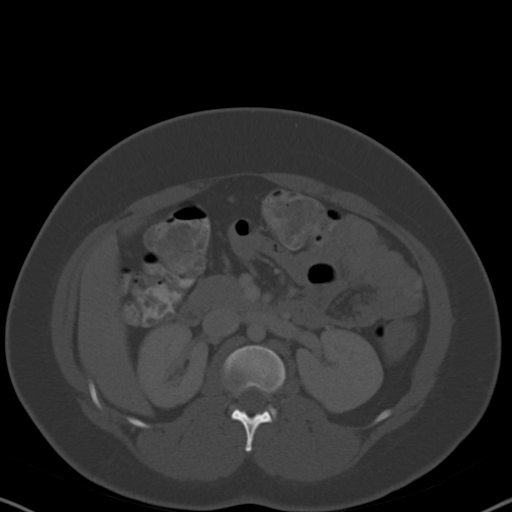
[im 70/99  soft-tissue]
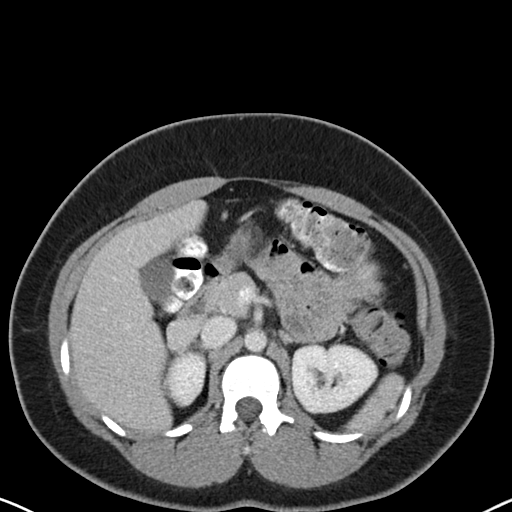
[im 78/99  soft-tissue]
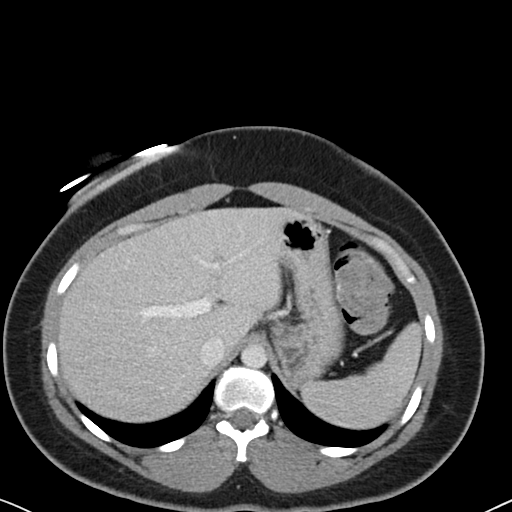
[im 86/99  soft-tissue]
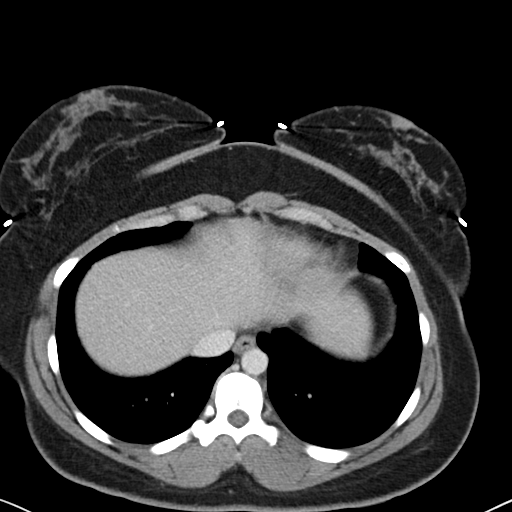
[im 94/99  soft-tissue]
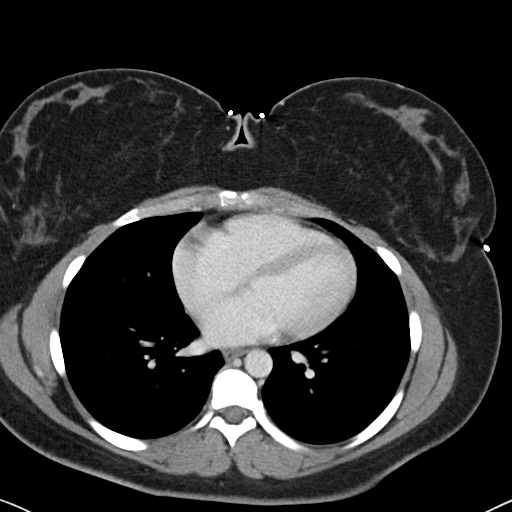

[Series 5: coronal soft tissue · coronal · 0.71mm/px · 3 of 82 slices shown]
[im 28/82  soft-tissue]
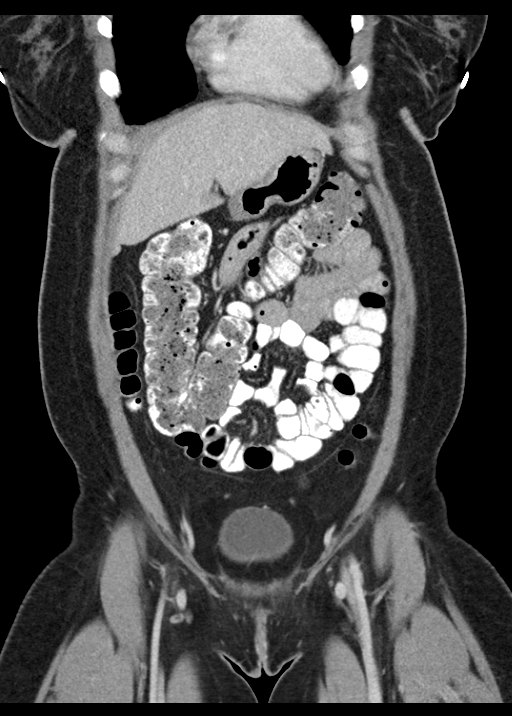
[im 37/82  soft-tissue]
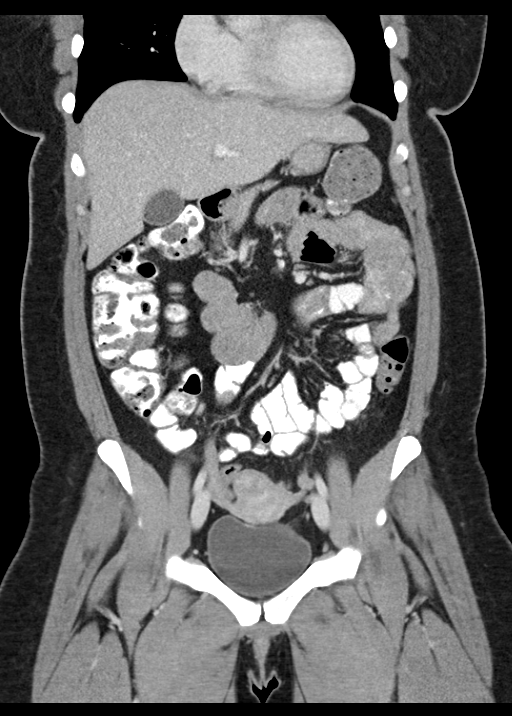
[im 46/82  soft-tissue]
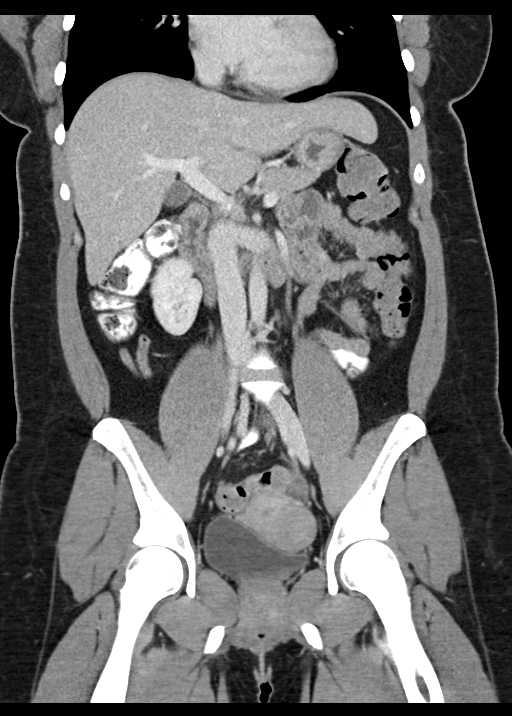

[16 of 46 positions shown; findings below may reference images not displayed]

FINDINGS: The visualized lung bases are clear. No intra-abdominal free air or
free fluid.

The liver, gallbladder, pancreas, spleen, adrenal glands, kidneys,
visualized ureters, and urinary bladder appear unremarkable. There
is a 3.7 cm left uterine fibroid. The visualized ovaries are
unremarkable.

Moderate stool throughout the colon. No evidence of bowel
obstruction or inflammation. Normal appendix.

The abdominal aorta and IVC appear unremarkable. No portal venous
gas identified. There is no lymphadenopathy. The visualized osseous
structures and soft tissues appear unremarkable.
IMPRESSION: No acute intra-abdominal or pelvic pathology.

Left uterine fibroid. Ultrasound may provide better evaluation of
the pelvic structures.

## 2019-07-09 ENCOUNTER — Ambulatory Visit: Payer: PRIVATE HEALTH INSURANCE | Attending: Internal Medicine

## 2019-07-09 DIAGNOSIS — Z23 Encounter for immunization: Secondary | ICD-10-CM

## 2019-07-09 NOTE — Progress Notes (Signed)
   Covid-19 Vaccination Clinic  Name:  Krista Leach    MRN: [id number] DOB: 08-10-1990  07/09/2019  [name] was observed post Covid-19 immunization for 15 minutes without incident. She was provided with Vaccine Information Sheet and instruction to access the V-Safe system.   [name] was instructed to call 911 with any severe reactions post vaccine: Marland Kitchen Difficulty breathing  . Swelling of face and throat  . A fast heartbeat  . A bad rash all over body  . Dizziness and weakness   Immunizations Administered    Name Date Dose VIS Date Route   Moderna COVID-19 Vaccine 07/09/2019  9:56 AM 0.5 mL 03/29/2019 Intramuscular   Manufacturer: Moderna   Lot: [id number]   NDC: 42595-638-75

## 2019-08-10 ENCOUNTER — Ambulatory Visit: Payer: PRIVATE HEALTH INSURANCE | Attending: Internal Medicine

## 2019-08-10 DIAGNOSIS — Z23 Encounter for immunization: Secondary | ICD-10-CM

## 2019-08-10 NOTE — Progress Notes (Signed)
   Covid-19 Vaccination Clinic  Name:  Krista Leach    MRN: [id number] DOB: [birth year]  08/10/2019  [name] was observed post Covid-19 immunization for 15 minutes without incident. She was provided with Vaccine Information Sheet and instruction to access the V-Safe system.   [name] was instructed to call 911 with any severe reactions post vaccine: Marland Kitchen Difficulty breathing  . Swelling of face and throat  . A fast heartbeat  . A bad rash all over body  . Dizziness and weakness   Immunizations Administered    Name Date Dose VIS Date Route   Moderna COVID-19 Vaccine 08/10/2019  9:11 AM 0.5 mL 03/29/2019 Intramuscular   Manufacturer: Moderna   Lot: [id number]   NDC: 89784-784-12

## 2024-01-21 ENCOUNTER — Emergency Department (HOSPITAL_COMMUNITY)
Admission: EM | Admit: 2024-01-21 | Discharge: 2024-01-21 | Disposition: A | Discharge status: Home / Self Care | Attending: Emergency Medicine | Admitting: Emergency Medicine

## 2024-01-21 ENCOUNTER — Emergency Department (HOSPITAL_COMMUNITY)

## 2024-01-21 ENCOUNTER — Encounter (HOSPITAL_COMMUNITY): Payer: Self-pay

## 2024-01-21 ENCOUNTER — Other Ambulatory Visit: Payer: Self-pay

## 2024-01-21 DIAGNOSIS — K8020 Calculus of gallbladder without cholecystitis without obstruction: Secondary | ICD-10-CM | POA: Insufficient documentation

## 2024-01-21 DIAGNOSIS — R1011 Right upper quadrant pain: Secondary | ICD-10-CM

## 2024-01-21 DIAGNOSIS — D259 Leiomyoma of uterus, unspecified: Secondary | ICD-10-CM | POA: Insufficient documentation

## 2024-01-21 LAB — LIPASE, BLOOD: Lipase: 68 U/L — ABNORMAL HIGH (ref 11–51)

## 2024-01-21 LAB — COMPREHENSIVE METABOLIC PANEL WITH GFR
ALT: 18 U/L (ref 0–44)
AST: 21 U/L (ref 15–41)
Albumin: 3.5 g/dL (ref 3.5–5.0)
Alkaline Phosphatase: 42 U/L (ref 38–126)
Anion gap: 9 (ref 5–15)
BUN: 10 mg/dL (ref 6–20)
CO2: 22 mmol/L (ref 22–32)
Calcium: 8.5 mg/dL — ABNORMAL LOW (ref 8.9–10.3)
Chloride: 107 mmol/L (ref 98–111)
Creatinine, Ser: 0.7 mg/dL (ref 0.44–1.00)
GFR, Estimated: >60 mL/min (ref >60)
Glucose, Bld: 116 mg/dL — ABNORMAL HIGH (ref 70–99)
Potassium: 3.9 mmol/L (ref 3.5–5.1)
Sodium: 138 mmol/L (ref 135–145)
Total Bilirubin: 0.6 mg/dL (ref 0.0–1.2)
Total Protein: 6.7 g/dL (ref 6.5–8.1)

## 2024-01-21 LAB — CBC
HCT: 35.1 % — ABNORMAL LOW (ref 36.0–46.0)
Hemoglobin: 11.5 g/dL — ABNORMAL LOW (ref 12.0–15.0)
MCH: 32.1 pg (ref 26.0–34.0)
MCHC: 32.8 g/dL (ref 30.0–36.0)
MCV: 98 fL (ref 80.0–100.0)
Platelets: 351 K/uL (ref 150–400)
RBC: 3.58 MIL/uL — ABNORMAL LOW (ref 3.87–5.11)
RDW: 12 % (ref 11.5–15.5)
WBC: 8.5 K/uL (ref 4.0–10.5)
nRBC: 0 % (ref 0.0–0.2)

## 2024-01-21 LAB — POC URINE PREG, ED: Preg Test, Ur: NEGATIVE

## 2024-01-21 LAB — URINALYSIS, ROUTINE W REFLEX MICROSCOPIC
Bilirubin Urine: NEGATIVE
Glucose, UA: NEGATIVE mg/dL
Hgb urine dipstick: NEGATIVE
Ketones, ur: NEGATIVE mg/dL
Leukocytes,Ua: NEGATIVE
Nitrite: NEGATIVE
Protein, ur: NEGATIVE mg/dL
Specific Gravity, Urine: 1.004 — ABNORMAL LOW (ref 1.005–1.030)
pH: 6 (ref 5.0–8.0)

## 2024-01-21 LAB — HCG, SERUM, QUALITATIVE: Preg, Serum: NEGATIVE

## 2024-01-21 MED ORDER — ONDANSETRON HCL 4 MG/2ML IJ SOLN
INTRAMUSCULAR | Status: AC
  Filled 2024-01-21: qty 2

## 2024-01-21 MED ORDER — OXYCODONE-ACETAMINOPHEN 5-325 MG PO TABS: 1.0000 | ORAL_TABLET | Freq: Four times a day (QID) | ORAL | 0 refills | Status: DC | PRN

## 2024-01-21 MED ORDER — KETOROLAC TROMETHAMINE 30 MG/ML IJ SOLN
30.0000 mg | Freq: Once | INTRAMUSCULAR | Status: AC
  Administered 2024-01-21: 30 mg via INTRAVENOUS
  Filled 2024-01-21: qty 1

## 2024-01-21 MED ORDER — IOHEXOL 300 MG/ML  SOLN
100.0000 mL | Freq: Once | INTRAMUSCULAR | Status: AC | PRN
  Administered 2024-01-21: 100 mL via INTRAVENOUS

## 2024-01-21 NOTE — ED Notes (Signed)
 Pt called stating they needed a ICD code for prescription to be filled. Vazquez spoke with EDP and then called pharmacy to give ICD code. CN called pt and updated her. 01/21/2024. 1650hrs

## 2024-01-21 NOTE — ED Triage Notes (Signed)
 Pov from home cc of upper abd pain that hurts worse when she lays down.  Emesis x2.  6/10

## 2024-01-21 NOTE — ED Notes (Signed)
 Patient aware of need for a urine sample. No need to urinate at this time.

## 2024-01-21 NOTE — ED Provider Notes (Signed)
 [name] AT [facility] Provider Note   CSN: [id number] Arrival date & time: 01/21/24  9583     Patient presents with: Abdominal Pain   Krista Leach is a 33 y.o. female.  {Add pertinent medical, surgical, social history, OB history to YEP:67052} Patient is a 33 year old female presenting with complaints of abdominal pain.  This started approximately 10 PM.  She describes constant pain to the right upper quadrant along with some nausea, but no vomiting.  No fevers or chills.  No bowel or bladder complaints.  No aggravating or alleviating factors.       Prior to Admission medications   Medication Sig Start Date End Date Taking? Authorizing Provider  acetaminophen  (TYLENOL ) 500 MG tablet Take 1,000 mg by mouth every 6 (six) hours as needed for mild pain.    [provider]  dicyclomine  (BENTYL ) 10 MG capsule Take 1 capsule (10 mg total) by mouth 4 (four) times daily -  before meals and at bedtime. 06/13/15   Marvis Camellia LABOR, NP    Allergies: Patient has no known allergies.    Review of Systems  All other systems reviewed and are negative.   Updated Vital Signs BP (!) 134/97   Pulse 75   Temp 99.6 F (37.6 C)   Resp 19   Ht 5' 2 (1.575 m)   Wt 88.5 kg   SpO2 97%   BMI 35.67 kg/m   Physical Exam Vitals and nursing note reviewed.  Constitutional:      General: She is not in acute distress.    Appearance: She is well-developed. She is not diaphoretic.  HENT:     Head: Normocephalic and atraumatic.  Cardiovascular:     Rate and Rhythm: Normal rate and regular rhythm.     Heart sounds: No murmur heard.    No friction rub. No gallop.  Pulmonary:     Effort: Pulmonary effort is normal. No respiratory distress.     Breath sounds: Normal breath sounds. No wheezing.  Abdominal:     General: Bowel sounds are normal. There is no distension.     Palpations: Abdomen is soft.     Tenderness: There is abdominal tenderness in the  right upper quadrant. There is no right CVA tenderness, left CVA tenderness, guarding or rebound.  Musculoskeletal:        General: Normal range of motion.     Cervical back: Normal range of motion and neck supple.  Skin:    General: Skin is warm and dry.  Neurological:     General: No focal deficit present.     Mental Status: She is alert and oriented to person, place, and time.     (all labs ordered are listed, but only abnormal results are displayed) Labs Reviewed  LIPASE, BLOOD - Abnormal; Notable for the following components:      Result Value   Lipase 68 (*)    All other components within normal limits  COMPREHENSIVE METABOLIC PANEL WITH GFR - Abnormal; Notable for the following components:   Glucose, Bld 116 (*)    Calcium 8.5 (*)    All other components within normal limits  CBC - Abnormal; Notable for the following components:   RBC 3.58 (*)    Hemoglobin 11.5 (*)    HCT 35.1 (*)    All other components within normal limits  URINALYSIS, ROUTINE W REFLEX MICROSCOPIC  HCG, SERUM, QUALITATIVE  POC URINE PREG, ED    EKG: None  Radiology: No results found.  {Document cardiac monitor, telemetry assessment procedure when appropriate:32947} Procedures   Medications Ordered in the ED  ketorolac  (TORADOL ) 30 MG/ML injection 30 mg (has no administration in time range)      {Click here for ABCD2, HEART and other calculators REFRESH Note before signing:1}                              Medical Decision Making Amount and/or Complexity of Data Reviewed Labs: ordered. Radiology: ordered.  Risk Prescription drug management.   ***  {Document critical care time when appropriate  Document review of labs and clinical decision tools ie CHADS2VASC2, etc  Document your independent review of radiology images and any outside records  Document your discussion with family members, caretakers and with consultants  Document social determinants of health affecting pt's care   Document your decision making why or why not admission, treatments were needed:32947:::1}   Final diagnoses:  None    ED Discharge Orders     None

## 2024-01-21 NOTE — Discharge Instructions (Addendum)
 Follow-up with general surgery for the gallbladder.  Follow-up with gynecology for the fibroids.  Watch to see if the pain associates with fatty foods.

## 2024-02-02 ENCOUNTER — Encounter: Payer: Self-pay | Admitting: General Surgery

## 2024-02-02 ENCOUNTER — Ambulatory Visit (INDEPENDENT_AMBULATORY_CARE_PROVIDER_SITE_OTHER): Admitting: General Surgery

## 2024-02-02 VITALS — BP 108/72 | HR 83 | Temp 98.1°F | Resp 12 | Ht 62.0 in | Wt 195.0 lb

## 2024-02-02 DIAGNOSIS — K8020 Calculus of gallbladder without cholecystitis without obstruction: Secondary | ICD-10-CM | POA: Diagnosis not present

## 2024-02-02 NOTE — Progress Notes (Signed)
 [facility] History and Physical  Reason for Referral: Gallstones  Referring Physician: ED   Chief Complaint   New Patient (Initial Visit)     Krista Leach is a 33 y.o. female.  HPI:   Discussed the use of AI scribe software for clinical note transcription with the patient, who gave verbal consent to proceed.  History of Present Illness Krista Leach is a 33 year old female with gallstones who presents with upper abdominal pain. She was referred by the emergency department for evaluation of upper abdominal pain.  She experienced acute upper abdominal pain on the night of September 25th, describing it as 'unbearable' and preventing her from lying down. The pain was accompanied by vomiting. She sought care at the emergency department, where imaging revealed gallstones. Since then, the pain has been manageable, and she has not experienced further episodes despite consuming fatty foods.  She has a history of two large fibroids, one protruding to the right, confirmed by a CT scan. She is awaiting scheduling for surgery for the fibroids.  She was previously on Wegovy, last taken on September 27th. She has not experienced significant issues with eating since the initial episode on September 25th.  During a recent trip to [place], she experienced a similar but less severe episode of upper abdominal pain, which she attributed to indigestion and managed with Tums.  No current abdominal pain or tenderness. She does feel like she has had some pain and fullness/ needing to vomit after meals in the past.     Past Medical History:  Diagnosis Date   Medical history non-contributory    No know PMH to date 05/02/15    Past Surgical History:  Procedure Laterality Date   COLONOSCOPY N/A 05/25/2015   DOQ:YZFNMMYNPID/WNMFJO ILEUM AND COLON   WISDOM TOOTH EXTRACTION      Family History  Problem Relation Age of Onset   Colon cancer Neg Hx    Inflammatory bowel disease  Neg Hx    Hypertension Mother    Diabetes Mother     Social History   Tobacco Use   Smoking status: Never   Smokeless tobacco: Never  Substance Use Topics   Alcohol use: Yes    Alcohol/week: 0.0 standard drinks of alcohol    Comment: Occasionally typically every 2-4 weeks, 1-2 drinks per sitting.   Drug use: No    Medications: I have reviewed the patient's current medications. Allergies as of 02/02/2024   No Known Allergies      Medication List        Accurate as of February 02, 2024  9:17 AM. If you have any questions, ask your nurse or doctor.          STOP taking these medications    acetaminophen  500 MG tablet Commonly known as: TYLENOL  Stopped by: [name]   dicyclomine  10 MG capsule Commonly known as: BENTYL  Stopped by: [name]       TAKE these medications    Lo Loestrin Fe 1 MG-10 MCG / 10 MCG tablet Generic drug: Norethindrone-Ethinyl Estradiol-Fe Biphas Take 1 tablet by mouth daily.   oxybutynin 10 MG 24 hr tablet Commonly known as: DITROPAN-XL Take 10 mg by mouth at bedtime.   oxyCODONE -acetaminophen  5-325 MG tablet Commonly known as: PERCOCET/ROXICET Take 1 tablet by mouth every 6 (six) hours as needed for severe pain (pain score 7-10).   sertraline 100 MG tablet Commonly known as: ZOLOFT Take 100 mg by mouth daily.  Wegovy 0.5 MG/0.5ML Soaj SQ injection Generic drug: semaglutide-weight management Inject 0.5 mg into the skin once a week. Takes on Sat         ROS:  A comprehensive review of systems was negative except for: Gastrointestinal: positive for upper abdominal pain  Blood pressure 108/72, pulse 83, temperature 98.1 F (36.7 C), temperature source Oral, resp. rate 12, height 5' 2 (1.575 m), weight 195 lb (88.5 kg), SpO2 92%.  Physical Exam GENERAL: Alert, cooperative, well developed, no acute distress HEENT: Normocephalic, normal oropharynx, moist mucous membranes CHEST: Clear to auscultation  bilaterally, no wheezes, rhonchi, or crackles CARDIOVASCULAR: Normal heart rate and rhythm ABDOMEN: Soft, tender in RUQ, non-distended, without organomegaly, normal bowel sounds EXTREMITIES: No cyanosis or edema NEUROLOGICAL: Cranial nerves grossly intact, moves all extremities without gross motor or sensory deficit  Results: Personally reviewed- CT with large fibroids in the right abdomen but separate from the location of the gallbladder  CLINICAL DATA:  Upper abdominal pain with nausea and vomiting.   EXAM: ULTRASOUND ABDOMEN LIMITED RIGHT UPPER QUADRANT   COMPARISON:  CT today.   FINDINGS: Gallbladder:   Mild cholelithiasis. No gallbladder wall thickening or pericholecystic fluid. Negative sonographic Murphy sign.   Common bile duct:   Diameter: 4 mm.   Liver:   No focal lesion identified. Within normal limits in parenchymal echogenicity. Portal vein is patent on color Doppler imaging with normal direction of blood flow towards the liver.   Other: No free fluid.   IMPRESSION: Mild cholelithiasis without additional sonographic evidence to suggest acute cholecystitis.     Electronically Signed   By: Toribio Agreste M.D.   On: 01/21/2024 08:49  EXAM: CT ABDOMEN AND PELVIS WITH CONTRAST 01/21/2024 06:07:22 AM   TECHNIQUE: CT of the abdomen and pelvis was performed with the administration of 100 mL of iohexol  (OMNIPAQUE ) 300 MG/ML solution. Multiplanar reformatted images are provided for review. Automated exposure control, iterative reconstruction, and/or weight-based adjustment of the mA/kV was utilized to reduce the radiation dose to as low as reasonably achievable.   COMPARISON: 04/05/2015   CLINICAL HISTORY: Abdominal pain, acute, nonlocalized. Upper abdominal pain that hurts worse when she lays down. Emesis x2.   FINDINGS:   LOWER CHEST: No acute abnormality.   LIVER: The liver is unremarkable.   GALLBLADDER AND BILE DUCTS: The gallbladder wall  thickness is upper limits of normal measuring 3.2 mm. No pericholecystic fluid or gallstones. No biliary ductal dilatation.   SPLEEN: No acute abnormality.   PANCREAS: No acute abnormality.   ADRENAL GLANDS: No acute abnormality.   KIDNEYS, URETERS AND BLADDER: No stones in the kidneys or ureters. No hydronephrosis. No perinephric or periureteral stranding. Urinary bladder is unremarkable.   GI AND BOWEL: Stomach appears normal. The appendix is visualized and is within normal limits. Moderate stool burden identified within the colon. No bowel dilatation or signs of inflammation. There is no bowel obstruction.   PERITONEUM AND RETROPERITONEUM: No free fluid or fluid collections. No signs of free air.   VASCULATURE: Aorta is normal in caliber.   LYMPH NODES: No lymphadenopathy.   REPRODUCTIVE ORGANS: Enlarged fibroid uterus is identified. There is a large pedunculated fibroid arising off the right side of fundus which measures 12.5 x 9.1 x 10.3 cm (axial image 53/2). This extends into the right upper quadrant. Within the left side of the body of uterus there is a large subserosal fibroid measuring 11.0 x 9.9 x 11.4 cm. Unremarkable appearance of the ovaries.   BONES AND SOFT  TISSUES: No acute osseous abnormality. No focal soft tissue abnormality.   IMPRESSION: 1. No acute abnormality in the abdomen or pelvis. 2. Enlarged fibroid uterus with a large pedunculated fibroid arising from the right fundus measuring 12.5 x 9.1 x 10.3 cm, extending into the right upper quadrant, and a large subserosal fibroid in the left uterine body measuring 11.0 x 9.9 x 11.4 cm. 3. Mild gallbladder distention. Gallbladder wall thickness is upper limits of normal at 3 mm. No pericholecystic fluid or gallstones identified.   Electronically signed by: Waddell Calk MD 01/21/2024 06:20 AM EDT RP     Assessment & Plan Cholelithiasis with biliary colic Cholelithiasis with biliary colic,  characterized by acute upper abdominal pain, nausea, and vomiting. Symptoms align with gallbladder etiology. Lipase slightly elevated, possibly due to vomiting or passing a small stone. No current symptoms since the last episode on January 21, 2024. - Schedule Robotic laparoscopic cholecystectomy as soon as possible - Discussed that she is wanting to get the fibroid surgery done but that Dr. Dewey her GYN says this will be an open procedure. Given the size and extent, I do not think she would likely want a combined case. Patient also has concerns for getting her surgery before her insurance changes 02/26/24.  I think referring to [facility] surgeon would delay her care and likely prevent her from cholecystectomy in that time frame and is unlikely to result in a combined case.  -Discussed with partner and showed CT image, fibroid should not prevent me from removing the gallbladder    PLAN: I counseled the patient about the indication, risks and benefits of robotic assisted laparoscopic cholecystectomy.  She understands there is a very small chance for bleeding, infection, injury to normal structures (including common bile duct), subtotal cholecystectomy, conversion to open surgery, persistent symptoms, evolution of postcholecystectomy diarrhea, need for secondary interventions, anesthesia reaction, cardiopulmonary issues and other risks not specifically detailed here. I described the expected recovery, the plan for follow-up and the restrictions during the recovery phase.  All questions were answered.    [name] 02/02/2024, 9:17 AM

## 2024-02-03 NOTE — H&P (Signed)
 [facility] History and Physical   Reason for Referral: Gallstones  Referring Physician: ED    Chief Complaint   New Patient (Initial Visit)        Krista Leach is a 33 y.o. female.  HPI:    Discussed the use of AI scribe software for clinical note transcription with the patient, who gave verbal consent to proceed.   History of Present Illness Krista Leach is a 33 year old female with gallstones who presents with upper abdominal pain. She was referred by the emergency department for evaluation of upper abdominal pain.   She experienced acute upper abdominal pain on the night of September 25th, describing it as 'unbearable' and preventing her from lying down. The pain was accompanied by vomiting. She sought care at the emergency department, where imaging revealed gallstones. Since then, the pain has been manageable, and she has not experienced further episodes despite consuming fatty foods.   She has a history of two large fibroids, one protruding to the right, confirmed by a CT scan. She is awaiting scheduling for surgery for the fibroids.   She was previously on Wegovy, last taken on September 27th. She has not experienced significant issues with eating since the initial episode on September 25th.   During a recent trip to [place], she experienced a similar but less severe episode of upper abdominal pain, which she attributed to indigestion and managed with Tums.   No current abdominal pain or tenderness. She does feel like she has had some pain and fullness/ needing to vomit after meals in the past.          Past Medical History:  Diagnosis Date   Medical history non-contributory      No know PMH to date 05/02/15               Past Surgical History:  Procedure Laterality Date   COLONOSCOPY N/A 05/25/2015    DOQ:YZFNMMYNPID/WNMFJO ILEUM AND COLON   WISDOM TOOTH EXTRACTION                   Family History  Problem Relation Age of Onset    Colon cancer Neg Hx     Inflammatory bowel disease Neg Hx     Hypertension Mother     Diabetes Mother            Social History  Social History         Tobacco Use   Smoking status: Never   Smokeless tobacco: Never  Substance Use Topics   Alcohol use: Yes      Alcohol/week: 0.0 standard drinks of alcohol      Comment: Occasionally typically every 2-4 weeks, 1-2 drinks per sitting.   Drug use: No        Medications: I have reviewed the patient's current medications. Allergies as of 02/02/2024   No Known Allergies         Medication List           Accurate as of February 02, 2024  9:17 AM. If you have any questions, ask your nurse or doctor.              STOP taking these medications     acetaminophen  500 MG tablet Commonly known as: TYLENOL  Stopped by: [name]    dicyclomine  10 MG capsule Commonly known as: BENTYL  Stopped by: [name]           TAKE these medications  Lo Loestrin Fe 1 MG-10 MCG / 10 MCG tablet Generic drug: Norethindrone-Ethinyl Estradiol-Fe Biphas Take 1 tablet by mouth daily.    oxybutynin 10 MG 24 hr tablet Commonly known as: DITROPAN-XL Take 10 mg by mouth at bedtime.    oxyCODONE -acetaminophen  5-325 MG tablet Commonly known as: PERCOCET/ROXICET Take 1 tablet by mouth every 6 (six) hours as needed for severe pain (pain score 7-10).    sertraline 100 MG tablet Commonly known as: ZOLOFT Take 100 mg by mouth daily.    Wegovy 0.5 MG/0.5ML Soaj SQ injection Generic drug: semaglutide-weight management Inject 0.5 mg into the skin once a week. Takes on Sat               ROS:  A comprehensive review of systems was negative except for: Gastrointestinal: positive for upper abdominal pain   Blood pressure 108/72, pulse 83, temperature 98.1 F (36.7 C), temperature source Oral, resp. rate 12, height 5' 2 (1.575 m), weight 195 lb (88.5 kg), SpO2 92%.   Physical Exam GENERAL: Alert, cooperative, well  developed, no acute distress HEENT: Normocephalic, normal oropharynx, moist mucous membranes CHEST: Clear to auscultation bilaterally, no wheezes, rhonchi, or crackles CARDIOVASCULAR: Normal heart rate and rhythm ABDOMEN: Soft, tender in RUQ, non-distended, without organomegaly, normal bowel sounds EXTREMITIES: No cyanosis or edema NEUROLOGICAL: Cranial nerves grossly intact, moves all extremities without gross motor or sensory deficit   Results: Personally reviewed- CT with large fibroids in the right abdomen but separate from the location of the gallbladder  CLINICAL DATA:  Upper abdominal pain with nausea and vomiting.   EXAM: ULTRASOUND ABDOMEN LIMITED RIGHT UPPER QUADRANT   COMPARISON:  CT today.   FINDINGS: Gallbladder:   Mild cholelithiasis. No gallbladder wall thickening or pericholecystic fluid. Negative sonographic Murphy sign.   Common bile duct:   Diameter: 4 mm.   Liver:   No focal lesion identified. Within normal limits in parenchymal echogenicity. Portal vein is patent on color Doppler imaging with normal direction of blood flow towards the liver.   Other: No free fluid.   IMPRESSION: Mild cholelithiasis without additional sonographic evidence to suggest acute cholecystitis.     Electronically Signed   By: Toribio Agreste M.D.   On: 01/21/2024 08:49   EXAM: CT ABDOMEN AND PELVIS WITH CONTRAST 01/21/2024 06:07:22 AM   TECHNIQUE: CT of the abdomen and pelvis was performed with the administration of 100 mL of iohexol  (OMNIPAQUE ) 300 MG/ML solution. Multiplanar reformatted images are provided for review. Automated exposure control, iterative reconstruction, and/or weight-based adjustment of the mA/kV was utilized to reduce the radiation dose to as low as reasonably achievable.   COMPARISON: 04/05/2015   CLINICAL HISTORY: Abdominal pain, acute, nonlocalized. Upper abdominal pain that hurts worse when she lays down. Emesis x2.   FINDINGS:   LOWER  CHEST: No acute abnormality.   LIVER: The liver is unremarkable.   GALLBLADDER AND BILE DUCTS: The gallbladder wall thickness is upper limits of normal measuring 3.2 mm. No pericholecystic fluid or gallstones. No biliary ductal dilatation.   SPLEEN: No acute abnormality.   PANCREAS: No acute abnormality.   ADRENAL GLANDS: No acute abnormality.   KIDNEYS, URETERS AND BLADDER: No stones in the kidneys or ureters. No hydronephrosis. No perinephric or periureteral stranding. Urinary bladder is unremarkable.   GI AND BOWEL: Stomach appears normal. The appendix is visualized and is within normal limits. Moderate stool burden identified within the colon. No bowel dilatation or signs of inflammation. There is no bowel obstruction.   PERITONEUM  AND RETROPERITONEUM: No free fluid or fluid collections. No signs of free air.   VASCULATURE: Aorta is normal in caliber.   LYMPH NODES: No lymphadenopathy.   REPRODUCTIVE ORGANS: Enlarged fibroid uterus is identified. There is a large pedunculated fibroid arising off the right side of fundus which measures 12.5 x 9.1 x 10.3 cm (axial image 53/2). This extends into the right upper quadrant. Within the left side of the body of uterus there is a large subserosal fibroid measuring 11.0 x 9.9 x 11.4 cm. Unremarkable appearance of the ovaries.   BONES AND SOFT TISSUES: No acute osseous abnormality. No focal soft tissue abnormality.   IMPRESSION: 1. No acute abnormality in the abdomen or pelvis. 2. Enlarged fibroid uterus with a large pedunculated fibroid arising from the right fundus measuring 12.5 x 9.1 x 10.3 cm, extending into the right upper quadrant, and a large subserosal fibroid in the left uterine body measuring 11.0 x 9.9 x 11.4 cm. 3. Mild gallbladder distention. Gallbladder wall thickness is upper limits of normal at 3 mm. No pericholecystic fluid or gallstones identified.   Electronically signed by: Waddell Calk MD 01/21/2024  06:20 AM EDT RP       Assessment & Plan Cholelithiasis with biliary colic Cholelithiasis with biliary colic, characterized by acute upper abdominal pain, nausea, and vomiting. Symptoms align with gallbladder etiology. Lipase slightly elevated, possibly due to vomiting or passing a small stone. No current symptoms since the last episode on January 21, 2024. - Schedule Robotic laparoscopic cholecystectomy as soon as possible - Discussed that she is wanting to get the fibroid surgery done but that Dr. Dewey her GYN says this will be an open procedure. Given the size and extent, I do not think she would likely want a combined case. Patient also has concerns for getting her surgery before her insurance changes 02/26/24.  I think referring to [facility] surgeon would delay her care and likely prevent her from cholecystectomy in that time frame and is unlikely to result in a combined case.   -Discussed with partner and showed CT image, fibroid should not prevent me from removing the gallbladder      PLAN: I counseled the patient about the indication, risks and benefits of robotic assisted laparoscopic cholecystectomy.  She understands there is a very small chance for bleeding, infection, injury to normal structures (including common bile duct), subtotal cholecystectomy, conversion to open surgery, persistent symptoms, evolution of postcholecystectomy diarrhea, need for secondary interventions, anesthesia reaction, cardiopulmonary issues and other risks not specifically detailed here. I described the expected recovery, the plan for follow-up and the restrictions during the recovery phase.  All questions were answered.       [name] 02/02/2024, 9:17 AM

## 2024-02-04 ENCOUNTER — Encounter (HOSPITAL_COMMUNITY)
Admission: RE | Admit: 2024-02-04 | Discharge: 2024-02-04 | Disposition: A | Discharge status: Home / Self Care | Payer: PRIVATE HEALTH INSURANCE | Source: Ambulatory Visit | Attending: General Surgery | Admitting: General Surgery

## 2024-02-04 ENCOUNTER — Other Ambulatory Visit: Payer: Self-pay

## 2024-02-04 ENCOUNTER — Encounter (HOSPITAL_COMMUNITY): Payer: Self-pay

## 2024-02-04 VITALS — Ht 62.0 in | Wt 195.0 lb

## 2024-02-04 DIAGNOSIS — Z01818 Encounter for other preprocedural examination: Secondary | ICD-10-CM

## 2024-02-04 HISTORY — DX: Anxiety disorder, unspecified: F41.9

## 2024-02-08 ENCOUNTER — Ambulatory Visit (HOSPITAL_COMMUNITY)
Admission: RE | Admit: 2024-02-08 | Discharge: 2024-02-08 | Disposition: A | Discharge status: Home / Self Care | Attending: General Surgery | Admitting: General Surgery

## 2024-02-08 ENCOUNTER — Ambulatory Visit (HOSPITAL_COMMUNITY): Admitting: Certified Registered Nurse Anesthetist

## 2024-02-08 ENCOUNTER — Encounter (HOSPITAL_COMMUNITY)
Admission: RE | Disposition: A | Discharge status: Home / Self Care | Payer: Self-pay | Source: Home / Self Care | Attending: General Surgery

## 2024-02-08 ENCOUNTER — Encounter (HOSPITAL_COMMUNITY): Payer: Self-pay | Admitting: General Surgery

## 2024-02-08 ENCOUNTER — Other Ambulatory Visit: Payer: Self-pay

## 2024-02-08 DIAGNOSIS — Z01818 Encounter for other preprocedural examination: Secondary | ICD-10-CM

## 2024-02-08 DIAGNOSIS — K8020 Calculus of gallbladder without cholecystitis without obstruction: Secondary | ICD-10-CM | POA: Diagnosis present

## 2024-02-08 DIAGNOSIS — K8064 Calculus of gallbladder and bile duct with chronic cholecystitis without obstruction: Secondary | ICD-10-CM | POA: Insufficient documentation

## 2024-02-08 DIAGNOSIS — I10 Essential (primary) hypertension: Secondary | ICD-10-CM | POA: Diagnosis not present

## 2024-02-08 HISTORY — PX: LAPAROSCOPIC CHOLECYSTECTOMY: SUR755

## 2024-02-08 LAB — POCT PREGNANCY, URINE: Preg Test, Ur: NEGATIVE

## 2024-02-08 SURGERY — CHOLECYSTECTOMY, ROBOT-ASSISTED, LAPAROSCOPIC
Anesthesia: General | Site: Abdomen

## 2024-02-08 MED ORDER — CHLORHEXIDINE GLUCONATE 0.12 % MT SOLN
15.0000 mL | Freq: Once | OROMUCOSAL | Status: AC
  Administered 2024-02-08: 15 mL via OROMUCOSAL
  Filled 2024-02-08: qty 15

## 2024-02-08 MED ORDER — MIDAZOLAM HCL 2 MG/2ML IJ SOLN
INTRAMUSCULAR | Status: DC | PRN
  Administered 2024-02-08: 2 mg via INTRAVENOUS

## 2024-02-08 MED ORDER — ONDANSETRON HCL 4 MG/2ML IJ SOLN
INTRAMUSCULAR | Status: DC | PRN
  Administered 2024-02-08: 4 mg via INTRAVENOUS

## 2024-02-08 MED ORDER — INDOCYANINE GREEN 25 MG IV SOLR: 2.5000 mg | Freq: Once | INTRAVENOUS | Status: AC

## 2024-02-08 MED ORDER — SUGAMMADEX SODIUM 200 MG/2ML IV SOLN
INTRAVENOUS | Status: DC | PRN
  Administered 2024-02-08: 200 mg via INTRAVENOUS

## 2024-02-08 MED ORDER — PROPOFOL 10 MG/ML IV BOLUS
INTRAVENOUS | Status: AC
  Filled 2024-02-08: qty 20

## 2024-02-08 MED ORDER — ONDANSETRON HCL 4 MG/2ML IJ SOLN: 4.0000 mg | Freq: Once | INTRAMUSCULAR | Status: DC | PRN

## 2024-02-08 MED ORDER — KETOROLAC TROMETHAMINE 30 MG/ML IJ SOLN
INTRAMUSCULAR | Status: DC | PRN
Start: 2024-02-08 — End: 2024-02-08
  Administered 2024-02-08: 30 mg via INTRAVENOUS

## 2024-02-08 MED ORDER — DEXMEDETOMIDINE HCL IN NACL 80 MCG/20ML IV SOLN
INTRAVENOUS | Status: DC | PRN
  Administered 2024-02-08: 8 ug via INTRAVENOUS

## 2024-02-08 MED ORDER — INDOCYANINE GREEN 25 MG IV SOLR
INTRAVENOUS | Status: AC
Start: 2024-02-08 — End: 2024-02-08
  Administered 2024-02-08: 2.5 mg via INTRAVENOUS
  Filled 2024-02-08: qty 10

## 2024-02-08 MED ORDER — SODIUM CHLORIDE 0.9 % IV SOLN
2.0000 g | INTRAVENOUS | Status: AC
  Administered 2024-02-08: 2 g via INTRAVENOUS
  Filled 2024-02-08: qty 2

## 2024-02-08 MED ORDER — KETOROLAC TROMETHAMINE 30 MG/ML IJ SOLN
INTRAMUSCULAR | Status: AC
  Filled 2024-02-08: qty 1

## 2024-02-08 MED ORDER — FENTANYL CITRATE (PF) 250 MCG/5ML IJ SOLN
INTRAMUSCULAR | Status: AC
  Filled 2024-02-08: qty 5

## 2024-02-08 MED ORDER — HYDROMORPHONE HCL 1 MG/ML IJ SOLN
INTRAMUSCULAR | Status: AC
  Filled 2024-02-08: qty 0.5

## 2024-02-08 MED ORDER — SCOPOLAMINE 1 MG/3DAYS TD PT72
MEDICATED_PATCH | TRANSDERMAL | Status: AC
  Filled 2024-02-08: qty 1

## 2024-02-08 MED ORDER — BUPIVACAINE HCL (PF) 0.5 % IJ SOLN
INTRAMUSCULAR | Status: DC | PRN
  Administered 2024-02-08: 30 mL

## 2024-02-08 MED ORDER — LACTATED RINGERS IV SOLN: INTRAVENOUS | Status: DC

## 2024-02-08 MED ORDER — ONDANSETRON HCL 4 MG PO TABS: 4.0000 mg | ORAL_TABLET | Freq: Three times a day (TID) | ORAL | 1 refills | Status: DC | PRN

## 2024-02-08 MED ORDER — DEXAMETHASONE SOD PHOSPHATE PF 10 MG/ML IJ SOLN
INTRAMUSCULAR | Status: DC | PRN
  Administered 2024-02-08: 5 mg via INTRAVENOUS

## 2024-02-08 MED ORDER — HYDROMORPHONE HCL 1 MG/ML IJ SOLN
INTRAMUSCULAR | Status: DC | PRN
  Administered 2024-02-08: .5 mg via INTRAVENOUS

## 2024-02-08 MED ORDER — FENTANYL CITRATE (PF) 50 MCG/ML IJ SOSY
25.0000 ug | PREFILLED_SYRINGE | INTRAMUSCULAR | Status: DC | PRN
  Administered 2024-02-08 (×2): 50 ug via INTRAVENOUS
  Filled 2024-02-08 (×2): qty 1

## 2024-02-08 MED ORDER — LIDOCAINE 2% (20 MG/ML) 5 ML SYRINGE
INTRAMUSCULAR | Status: AC
  Filled 2024-02-08: qty 5

## 2024-02-08 MED ORDER — CHLORHEXIDINE GLUCONATE CLOTH 2 % EX PADS
6.0000 | MEDICATED_PAD | Freq: Once | CUTANEOUS | Status: DC
Start: 2024-02-08 — End: 2024-02-08

## 2024-02-08 MED ORDER — FENTANYL CITRATE (PF) 100 MCG/2ML IJ SOLN
INTRAMUSCULAR | Status: DC | PRN
  Administered 2024-02-08 (×2): 100 ug via INTRAVENOUS
  Administered 2024-02-08: 50 ug via INTRAVENOUS

## 2024-02-08 MED ORDER — PROPOFOL 10 MG/ML IV BOLUS
INTRAVENOUS | Status: DC | PRN
  Administered 2024-02-08: 200 mg via INTRAVENOUS

## 2024-02-08 MED ORDER — STERILE WATER FOR IRRIGATION IR SOLN
Status: DC | PRN
  Administered 2024-02-08: 500 mL

## 2024-02-08 MED ORDER — LIDOCAINE 2% (20 MG/ML) 5 ML SYRINGE
INTRAMUSCULAR | Status: DC | PRN
Start: 2024-02-08 — End: 2024-02-08
  Administered 2024-02-08: 60 mg via INTRAVENOUS

## 2024-02-08 MED ORDER — OXYCODONE HCL 5 MG PO TABS: 5.0000 mg | ORAL_TABLET | ORAL | 0 refills | Status: DC | PRN

## 2024-02-08 MED ORDER — CHLORHEXIDINE GLUCONATE 0.12 % MT SOLN: 15.0000 mL | Freq: Once | OROMUCOSAL | Status: DC

## 2024-02-08 MED ORDER — ROCURONIUM BROMIDE 10 MG/ML (PF) SYRINGE
PREFILLED_SYRINGE | INTRAVENOUS | Status: DC | PRN
  Administered 2024-02-08: 60 mg via INTRAVENOUS

## 2024-02-08 MED ORDER — CHLORHEXIDINE GLUCONATE CLOTH 2 % EX PADS: 6.0000 | MEDICATED_PAD | Freq: Once | CUTANEOUS | Status: DC

## 2024-02-08 MED ORDER — MIDAZOLAM HCL 2 MG/2ML IJ SOLN
INTRAMUSCULAR | Status: AC
  Filled 2024-02-08: qty 2

## 2024-02-08 MED ORDER — BUPIVACAINE HCL (PF) 0.5 % IJ SOLN
INTRAMUSCULAR | Status: AC
  Filled 2024-02-08: qty 30

## 2024-02-08 MED ORDER — ONDANSETRON HCL 4 MG/2ML IJ SOLN
INTRAMUSCULAR | Status: AC
  Filled 2024-02-08: qty 2

## 2024-02-08 MED ORDER — ROCURONIUM BROMIDE 10 MG/ML (PF) SYRINGE
PREFILLED_SYRINGE | INTRAVENOUS | Status: AC
  Filled 2024-02-08: qty 10

## 2024-02-08 MED ORDER — OXYCODONE HCL 5 MG PO TABS
5.0000 mg | ORAL_TABLET | Freq: Once | ORAL | Status: AC | PRN
  Administered 2024-02-08: 5 mg via ORAL
  Filled 2024-02-08: qty 1

## 2024-02-08 MED ORDER — SCOPOLAMINE 1 MG/3DAYS TD PT72
1.0000 | MEDICATED_PATCH | TRANSDERMAL | Status: DC
  Administered 2024-02-08: 1 mg via TRANSDERMAL

## 2024-02-08 MED ORDER — ORAL CARE MOUTH RINSE: 15.0000 mL | Freq: Once | OROMUCOSAL | Status: DC

## 2024-02-08 MED ORDER — OXYCODONE HCL 5 MG/5ML PO SOLN: 5.0000 mg | Freq: Once | ORAL | Status: AC | PRN

## 2024-02-08 SURGICAL SUPPLY — 35 items
BLADE SURG 15 STRL LF DISP TIS (BLADE) ×1 IMPLANT
CAUTERY HOOK MNPLR 1.6 DVNC XI (INSTRUMENTS) ×1 IMPLANT
CHLORAPREP W/TINT 26 (MISCELLANEOUS) ×1 IMPLANT
CLIP APPLIE XI LRG REUSE DVNC (INSTRUMENTS) IMPLANT
CLIP LIGATING HEM O LOK PURPLE (MISCELLANEOUS) ×1 IMPLANT
COVER LIGHT HANDLE STERIS (MISCELLANEOUS) ×2 IMPLANT
DERMABOND ADVANCED .7 DNX12 (GAUZE/BANDAGES/DRESSINGS) ×1 IMPLANT
DRAPE ARM DVNC X/XI (DISPOSABLE) ×4 IMPLANT
DRAPE COLUMN DVNC XI (DISPOSABLE) ×1 IMPLANT
ELECTRODE REM PT RTRN 9FT ADLT (ELECTROSURGICAL) ×1 IMPLANT
FORCEPS BPLR R/ABLATION 8 DVNC (INSTRUMENTS) ×1 IMPLANT
FORCEPS PROGRASP DVNC XI (FORCEP) ×1 IMPLANT
GLOVE BIO SURGEON STRL SZ 6.5 (GLOVE) ×2 IMPLANT
GLOVE BIOGEL PI IND STRL 6.5 (GLOVE) ×2 IMPLANT
GLOVE BIOGEL PI IND STRL 7.0 (GLOVE) ×2 IMPLANT
GOWN STRL REUS W/TWL LRG LVL3 (GOWN DISPOSABLE) ×3 IMPLANT
GRASPER SUT TROCAR 14GX15 (MISCELLANEOUS) ×1 IMPLANT
KIT TURNOVER KIT A (KITS) ×1 IMPLANT
NDL HYPO 21X1.5 SAFETY (NEEDLE) ×1 IMPLANT
NDL INSUFFLATION 14GA 120MM (NEEDLE) ×1 IMPLANT
NEEDLE HYPO 21X1.5 SAFETY (NEEDLE) ×1 IMPLANT
NEEDLE INSUFFLATION 14GA 120MM (NEEDLE) ×1 IMPLANT
OBTURATOR OPTICALSTD 8 DVNC (TROCAR) ×1 IMPLANT
PACK LAP CHOLE LZT030E (CUSTOM PROCEDURE TRAY) ×1 IMPLANT
PAD ARMBOARD POSITIONER FOAM (MISCELLANEOUS) ×1 IMPLANT
PENCIL HANDSWITCHING (ELECTRODE) ×1 IMPLANT
POSITIONER HEAD 8X9X4 ADT (SOFTGOODS) ×1 IMPLANT
SEAL UNIV 5-12 XI (MISCELLANEOUS) ×4 IMPLANT
SET BASIN LINEN APH (SET/KITS/TRAYS/PACK) ×1 IMPLANT
SET TUBE SMOKE EVAC HIGH FLOW (TUBING) IMPLANT
SUT MNCRL AB 4-0 PS2 18 (SUTURE) ×2 IMPLANT
SUT VICRYL 0 UR6 27IN ABS (SUTURE) ×1 IMPLANT
SYR 30ML LL (SYRINGE) ×1 IMPLANT
SYSTEM RETRIEVL 5MM INZII UNIV (BASKET) ×1 IMPLANT
WATER STERILE IRR 500ML POUR (IV SOLUTION) ×1 IMPLANT

## 2024-02-08 NOTE — Anesthesia Preprocedure Evaluation (Signed)
 Anesthesia Evaluation  Patient identified by MRN, date of birth, ID band Patient awake    Reviewed: Allergy & Precautions, H&P , NPO status , Patient's Chart, lab work & pertinent test results, reviewed documented beta blocker date and time   Airway Mallampati: II  TM Distance: >3 FB Neck ROM: full    Dental no notable dental hx.    Pulmonary neg pulmonary ROS   Pulmonary exam normal breath sounds clear to auscultation       Cardiovascular Exercise Tolerance: Good hypertension, negative cardio ROS  Rhythm:regular Rate:Normal     Neuro/Psych   Anxiety     negative neurological ROS  negative psych ROS   GI/Hepatic negative GI ROS, Neg liver ROS,,,  Endo/Other  negative endocrine ROS    Renal/GU negative Renal ROS  negative genitourinary   Musculoskeletal   Abdominal   Peds  Hematology negative hematology ROS (+)   Anesthesia Other Findings   Reproductive/Obstetrics negative OB ROS                              Anesthesia Physical Anesthesia Plan  ASA: 2  Anesthesia Plan: General and General ETT   Post-op Pain Management:    Induction:   PONV Risk Score and Plan: Ondansetron   Airway Management Planned:   Additional Equipment:   Intra-op Plan:   Post-operative Plan:   Informed Consent: I have reviewed the patients History and Physical, chart, labs and discussed the procedure including the risks, benefits and alternatives for the proposed anesthesia with the patient or authorized representative who has indicated his/her understanding and acceptance.     Dental Advisory Given  Plan Discussed with: CRNA  Anesthesia Plan Comments:         Anesthesia Quick Evaluation

## 2024-02-08 NOTE — Anesthesia Postprocedure Evaluation (Signed)
 Anesthesia Post Note  Patient: Krista Leach  Procedure(s) Performed: CHOLECYSTECTOMY, ROBOT-ASSISTED, LAPAROSCOPIC (Abdomen)  Patient location during evaluation: Phase II Anesthesia Type: General Level of consciousness: awake Pain management: pain level controlled Vital Signs Assessment: post-procedure vital signs reviewed and stable Respiratory status: spontaneous breathing and respiratory function stable Cardiovascular status: blood pressure returned to baseline and stable Postop Assessment: no headache and no apparent nausea or vomiting Anesthetic complications: no Comments: Late entry   No notable events documented.   Last Vitals:  Vitals:   02/08/24 1000 02/08/24 1004  BP: 118/85 117/89  Pulse: 95 94  Resp: 15 18  Temp:  36.7 C  SpO2: 90% 93%    Last Pain:  Vitals:   02/08/24 1004  TempSrc: Oral  PainSc: 3                  Yvonna JINNY Bosworth

## 2024-02-08 NOTE — Anesthesia Procedure Notes (Signed)
 Procedure Name: Intubation Date/Time: 02/08/2024 7:41 AM  Performed by: Elaine Delon CROME, CRNAPre-anesthesia Checklist: Patient identified, Emergency Drugs available, Suction available and Patient being monitored Patient Re-evaluated:Patient Re-evaluated prior to induction Oxygen Delivery Method: Circle system utilized Preoxygenation: Pre-oxygenation with 100% oxygen Induction Type: IV induction Ventilation: Mask ventilation without difficulty Laryngoscope Size: Mac and 3 Grade View: Grade II Tube type: Oral Tube size: 7.0 mm Number of attempts: 1 Airway Equipment and Method: Stylet Placement Confirmation: ETT inserted through vocal cords under direct vision, positive ETCO2 and breath sounds checked- equal and bilateral Secured at: 21 cm Tube secured with: Tape Dental Injury: Teeth and Oropharynx as per pre-operative assessment

## 2024-02-08 NOTE — Transfer of Care (Signed)
 mediate Anesthesia Transfer of Care Note  Patient: Krista Leach  Procedure(s) Performed: CHOLECYSTECTOMY, ROBOT-ASSISTED, LAPAROSCOPIC (Abdomen)  Patient Location: PACU  Anesthesia Type:General  Level of Consciousness: awake  Airway & Oxygen Therapy: Patient Spontanous Breathing and Patient connected to face mask oxygen  Post-op Assessment: Report given to RN and Post -op Vital signs reviewed and stable  Post vital signs: Reviewed and stable  Last Vitals:  Vitals Value Taken Time  BP 109/68 02/08/24 08:55  Temp 98.5   Pulse 95 02/08/24 08:57  Resp 14   SpO2 94 % 02/08/24 08:57  Vitals shown include unfiled device data.  Last Pain:  Vitals:   02/08/24 0650  TempSrc: Oral  PainSc: 0-No pain      Patients Stated Pain Goal: 5 (02/08/24 0650)  Complications: No notable events documented.

## 2024-02-08 NOTE — Interval H&P Note (Signed)
 History and Physical Interval Note:  02/08/2024 7:22 AM  Krista Leach  has presented today for surgery, with the diagnosis of CHOLELITHIASIS.  The various methods of treatment have been discussed with the patient and family. After consideration of risks, benefits and other options for treatment, the patient has consented to  Procedure(s): CHOLECYSTECTOMY, ROBOT-ASSISTED, LAPAROSCOPIC (N/A) as a surgical intervention.  The patient's history has been reviewed, patient examined, no change in status, stable for surgery.  I have reviewed the patient's chart and labs.  Questions were answered to the patient's satisfaction.     [name]

## 2024-02-08 NOTE — Discharge Instructions (Signed)
 Discharge Robotic Assisted Laparoscopic Surgery Instructions:  Common Complaints: Right shoulder pain is common after laparoscopic surgery.  This is secondary to the gas used in the surgery being trapped under the diaphragm.  Walk to help your body absorb the gas. This will improve in a few days. Pain at the port sites are common, especially the larger port sites. This will improve with time.  Some nausea is common and poor appetite. The main goal is to stay hydrated the first few days after surgery.   Diet/ Activity: Diet as tolerated. You may not have an appetite, but it is important to stay hydrated.  Drink 64 ounces of water  a day. Your appetite will return with time.  Shower per your regular routine daily.  Do not take hot showers. Take warm showers that are less than 10 minutes. Rest and listen to your body, but do not remain in bed all day.  Walk everyday for at least 15-20 minutes. Deep cough and move around every 1-2 hours in the first few days after surgery.  Do not lift > 10 lbs, perform excessive bending, pushing, pulling, squatting for 1-2 weeks after surgery.  Do not pick at the dermabond glue on your incision sites.  This glue film will remain in place for 1-2 weeks and will start to peel off.  Do not place lotions or balms on your incision unless instructed to specifically by Dr. Kallie.   Pain Expectations and Narcotics: -After surgery you will have pain associated with your incisions and this is normal. The pain is muscular and nerve pain, and will get better with time. -You are encouraged and expected to take non narcotic medications like tylenol  and ibuprofen  (when able) to treat pain as multiple modalities can aid with pain treatment. -Narcotics are only used when pain is severe or there is breakthrough pain. -You are not expected to have a pain score of 0 after surgery, as we cannot prevent pain. A pain score of 3-4 that allows you to be functional, move, walk, and  tolerate some activity is the goal. The pain will continue to improve over the days after surgery and is dependent on your surgery. -Due to [place] law, we are only able to give a certain amount of pain medication to treat post operative pain, and we only give additional narcotics on a patient by patient basis.  -For most laparoscopic surgery, studies have shown that the majority of patients only need 10-15 narcotic pills, and for open surgeries most patients only need 15-20.   -Having appropriate expectations of pain and knowledge of pain management with non narcotics is important as we do not want anyone to become addicted to narcotic pain medication.  -Using ice packs in the first 48 hours and heating pads after 48 hours, wearing an abdominal binder (when recommended), and using over the counter medications are all ways to help with pain management.   -Simple acts like meditation and mindfulness practices after surgery can also help with pain control and research has proven the benefit of these practices.  Medication: Take tylenol  and ibuprofen  as needed for pain control, alternating every 4-6 hours.  Example:  Tylenol  1000mg  @ 6am, 12noon, 6pm, (Do not exceed 4000mg  of tylenol  a day). Ibuprofen  800mg  @ 9am, 3pm, 9pm, 3am (Do not exceed 3600mg  of ibuprofen  a day).  Take Roxicodone  for breakthrough pain every 4 hours.  Take Colace for constipation related to narcotic pain medication. If you do not have a bowel movement in  2 days, take Miralax over the counter.  Drink plenty of water  to also prevent constipation.   Contact Information: If you have questions or concerns, please call our office, [phone], Monday- Thursday 8AM-5PM and Friday 8AM-12Noon.  If it is after hours or on the weekend, please call [facility], [phone], [phone], and ask to speak to the surgeon on call for Dr. Kallie at [facility].

## 2024-02-08 NOTE — Progress Notes (Signed)
 [facility]  Updated mother. Rx to [facility]. Letter for work printed to go back to work 10/20. 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  Manuelita Pander, MD [facility] [address] [place], [place] [address] [phone] (office)

## 2024-02-08 NOTE — Op Note (Signed)
 [facility] Operative Note  02/08/24  Preoperative Diagnosis: Symptomatic Cholelithiasis   Postoperative Diagnosis: Same   Procedure(s) Performed: Robotic Assisted Laparoscopic Cholecystectomy   Surgeon: Manuelita BROCKS. Kallie, MD   Assistants: No qualified resident was available    Anesthesia: General endotracheal   Anesthesiologist: Kendell Yvonna PARAS, MD    Specimens: Gallbladder   Estimated Blood Loss: Minimal   Blood Replacement: None    Complications: None   Wound Class: Clean contaminated   Operative Indications: The patient was found to have gallstones on imaging and was symptomatic.  We discussed the risk of the procedure including but not limited to bleeding, infection, injury to the common bile duct, bile leak, need for further procedures, chance of subtotal cholecystectomy.   Findings:  Large fibroids in the right lower quadrant  Critical view of safety noted All clips intact at the end of the case Adequate hemostasis   Procedure: Firefly was given in the preoperative area. The patient was taken to the operating room and placed supine. General endotracheal anesthesia was induced. Intravenous antibiotics were administered per protocol.  An orogastric tube positioned to decompress the stomach. The abdomen was prepared and draped in the usual sterile fashion.   Veress needle was placed at the supraumbilical area and insufflation was started after confirming a positive saline drop test and no immediate increase in abdominal pressure.  After reaching 15 mm, the Veress needle was removed and a 8 mm port was placed via optiview technique supraumbilical, measuring 20 mm away from the suspected position of the gallbladder.  The abdomen was inspected and the large fibroid in the right lower quadrant was noted. No injuries were found. The table was placed in the reverse Trendelenburg position with the right side up to help with port placement and get the fibroid  out of the right side.  Under direct vision, ports were placed in the following locations in a semi curvilinear position around the target of the gallbladder. Two 8 mm ports on the patient's right each having 8cm clearance to the adjacent ports and one 8 mm port placed on the patient's left 8 cm from the umbilical port. Once ports were placed, the robotic platform was brought into the operative field and docked to the ports successfully.  An endoscope was placed through the umbilical port, prograsper through the most lateral right port, forced bipolar to the port just right of the umbilicus, and then a hook cautery in the left port.  The dome of the gallbladder was grasped with prograsp and retracted over the dome of the liver. Adhesions between the gallbladder and omentum, duodenum and transverse colon were lysed via hook cautery. The infundibulum was grasped with the forced bipolar and retracted toward the right lower quadrant. This maneuver exposed Calot's triangle. Firefly was used throughout the dissection to ensure safe visualization of the cystic duct.The peritoneum overlying the gallbladder infundibulum was then dissected and the cystic duct and cystic artery identified.  Critical view of safety with the liver bed clearly visible behind the duct and artery with no additional structures noted.  The cystic duct and cystic artery were doubly clipped and divided close to the gallbladder.    The gallbladder was then dissected from its peritoneal and liver bed attachments by electrocautery. Hemostasis was checked prior to removing the hook cautery.  A 5mm Endo Catch bag was then placed through the left side port. The robotic was undocked and moved out of the field,  and the gallbladder was removed  in the bag.  The gallbladder was passed off the table as a specimen. There was no evidence of bleeding from the gallbladder fossa or cystic artery or leakage of the bile from the cystic duct stump. The left port site  closed with a 0 vicryl and PMI due to dilation from removing the gallbladder.The abdomen was desufflated and secondary trocars were removed under direct vision.   No bleeding was noted. All skin incisions were closed with subcuticular sutures of 4-0 monocryl and dermabond.   Final inspection revealed acceptable hemostasis. All counts were correct at the end of the case. The patient was awakened from anesthesia and extubated without complication. The OG tube was removed.  The patient went to the PACU in stable condition.   Manuelita Pander, MD [facility] [address] [place], [place] [address] [phone] (office)

## 2024-02-09 LAB — SURGICAL PATHOLOGY

## 2024-02-10 ENCOUNTER — Other Ambulatory Visit: Payer: Self-pay | Admitting: Obstetrics & Gynecology

## 2024-02-23 ENCOUNTER — Ambulatory Visit (INDEPENDENT_AMBULATORY_CARE_PROVIDER_SITE_OTHER): Admitting: General Surgery

## 2024-02-23 DIAGNOSIS — K8020 Calculus of gallbladder without cholecystitis without obstruction: Secondary | ICD-10-CM

## 2024-02-23 NOTE — Progress Notes (Signed)
 [facility]  I am calling the patient for post operative evaluation. This is not a billable encounter as it is under the global charges for the surgery.  The patient had a robotic cholecystectomy on 02/08/24. The patient reports that she is doing well. The are tolerating a diet, having good pain control, and having regular Bms.  The incisions are healing but does have some minor soreness that is improving. She has irritation from her pants on the left side but just around the incision. No redness extending out. The patient has no concerns.   Pathology: A. GALLBLADDER, CHOLECYSTECTOMY:       Chronic cholecystitis.       Cholesterolosis.       Cholelithiasis.   Will see the patient PRN.  Diet and activity as tolerated. She is scheduled for her fibroid surgery on Tuesday in [place].    Manuelita Pander, MD [facility] [address] [place] [address] [place], [place] [address] [phone] (office)

## 2024-02-29 ENCOUNTER — Other Ambulatory Visit: Payer: Self-pay

## 2024-02-29 ENCOUNTER — Encounter (HOSPITAL_COMMUNITY): Payer: Self-pay | Admitting: Obstetrics & Gynecology

## 2024-02-29 NOTE — Progress Notes (Signed)
 SDW CALL  Patient was given pre-op instructions over the phone. The opportunity was given for the patient to ask questions. No further questions asked. Patient verbalized understanding of instructions given.   PCP - Norleen Shin (Care Everywhere) Cardiologist - denies  PPM/ICD - denies Device Orders - n/a Rep Notified -  n/a  Chest x-ray - denies EKG - denies Stress Test - denies ECHO - 05/02/20 Cardiac Cath - denies  Sleep Study - denies  No DM.   Last dose of GLP1 agonist-  last dose of Wegovy - mid September GLP1 instructions:  patient instructed not to take today or DOS  Blood Thinner Instructions: n/a Aspirin Instructions: n/a  ERAS Protcol - clears until 1030 PRE-SURGERY Ensure or G2-  n/a  COVID TEST-  n/a   Anesthesia review: no  Patient denies shortness of breath, fever, cough and chest pain over the phone call   All instructions explained to the patient, with a verbal understanding of the material. Patient agrees to go over the instructions while at home for a better understanding.

## 2024-03-01 ENCOUNTER — Inpatient Hospital Stay (HOSPITAL_COMMUNITY)

## 2024-03-01 ENCOUNTER — Encounter (HOSPITAL_COMMUNITY): Payer: Self-pay | Admitting: Obstetrics & Gynecology

## 2024-03-01 ENCOUNTER — Other Ambulatory Visit: Payer: Self-pay

## 2024-03-01 ENCOUNTER — Encounter (HOSPITAL_COMMUNITY)
Admission: RE | Disposition: A | Discharge status: Home / Self Care | Payer: Self-pay | Source: Home / Self Care | Attending: Obstetrics & Gynecology

## 2024-03-01 ENCOUNTER — Inpatient Hospital Stay (HOSPITAL_COMMUNITY)
Admission: RE | Admit: 2024-03-01 | Discharge: 2024-03-02 | DRG: 743 | Disposition: A | Discharge status: Home / Self Care | Attending: Obstetrics & Gynecology | Admitting: Obstetrics & Gynecology

## 2024-03-01 DIAGNOSIS — Z7985 Long-term (current) use of injectable non-insulin antidiabetic drugs: Secondary | ICD-10-CM

## 2024-03-01 DIAGNOSIS — Z9889 Other specified postprocedural states: Secondary | ICD-10-CM

## 2024-03-01 DIAGNOSIS — Z833 Family history of diabetes mellitus: Secondary | ICD-10-CM

## 2024-03-01 DIAGNOSIS — D259 Leiomyoma of uterus, unspecified: Secondary | ICD-10-CM

## 2024-03-01 DIAGNOSIS — D649 Anemia, unspecified: Secondary | ICD-10-CM | POA: Diagnosis present

## 2024-03-01 DIAGNOSIS — Z8249 Family history of ischemic heart disease and other diseases of the circulatory system: Secondary | ICD-10-CM

## 2024-03-01 DIAGNOSIS — Z79899 Other long term (current) drug therapy: Secondary | ICD-10-CM

## 2024-03-01 HISTORY — PX: MYOMECTOMY: SHX85

## 2024-03-01 LAB — CBC
HCT: 37.6 % (ref 36.0–46.0)
HCT: 34.4 % — ABNORMAL LOW (ref 36.0–46.0)
Hemoglobin: 12.1 g/dL (ref 12.0–15.0)
Hemoglobin: 11.4 g/dL — ABNORMAL LOW (ref 12.0–15.0)
MCH: 31.3 pg (ref 26.0–34.0)
MCH: 31.7 pg (ref 26.0–34.0)
MCHC: 32.2 g/dL (ref 30.0–36.0)
MCHC: 33.1 g/dL (ref 30.0–36.0)
MCV: 97.4 fL (ref 80.0–100.0)
MCV: 95.6 fL (ref 80.0–100.0)
Platelets: 399 K/uL (ref 150–400)
Platelets: 362 K/uL (ref 150–400)
RBC: 3.86 MIL/uL — ABNORMAL LOW (ref 3.87–5.11)
RBC: 3.6 MIL/uL — ABNORMAL LOW (ref 3.87–5.11)
RDW: 11.9 % (ref 11.5–15.5)
RDW: 11.7 % (ref 11.5–15.5)
WBC: 5.9 K/uL (ref 4.0–10.5)
WBC: 11.9 K/uL — ABNORMAL HIGH (ref 4.0–10.5)
nRBC: 0 % (ref 0.0–0.2)
nRBC: 0 % (ref 0.0–0.2)

## 2024-03-01 LAB — ABO/RH: ABO/RH(D): A POS

## 2024-03-01 LAB — BASIC METABOLIC PANEL WITH GFR
Anion gap: 11 (ref 5–15)
BUN: 8 mg/dL (ref 6–20)
CO2: 20 mmol/L — ABNORMAL LOW (ref 22–32)
Calcium: 9 mg/dL (ref 8.9–10.3)
Chloride: 105 mmol/L (ref 98–111)
Creatinine, Ser: 0.63 mg/dL (ref 0.44–1.00)
GFR, Estimated: >60 mL/min (ref >60)
Glucose, Bld: 90 mg/dL (ref 70–99)
Potassium: 3.8 mmol/L (ref 3.5–5.1)
Sodium: 136 mmol/L (ref 135–145)

## 2024-03-01 LAB — TYPE AND SCREEN
ABO/RH(D): A POS
Antibody Screen: NEGATIVE

## 2024-03-01 LAB — POCT PREGNANCY, URINE: Preg Test, Ur: NEGATIVE

## 2024-03-01 SURGERY — MYOMECTOMY, ABDOMINAL APPROACH
Anesthesia: General | Site: Abdomen

## 2024-03-01 MED ORDER — FENTANYL CITRATE (PF) 250 MCG/5ML IJ SOLN
INTRAMUSCULAR | Status: DC | PRN
  Administered 2024-03-01 (×2): 50 ug via INTRAVENOUS

## 2024-03-01 MED ORDER — OXYCODONE HCL 5 MG/5ML PO SOLN: 5.0000 mg | Freq: Once | ORAL | Status: DC | PRN

## 2024-03-01 MED ORDER — IBUPROFEN 600 MG PO TABS: 600.0000 mg | ORAL_TABLET | Freq: Four times a day (QID) | ORAL | Status: DC

## 2024-03-01 MED ORDER — FENTANYL CITRATE (PF) 100 MCG/2ML IJ SOLN
INTRAMUSCULAR | Status: AC
  Filled 2024-03-01: qty 2

## 2024-03-01 MED ORDER — CHLORHEXIDINE GLUCONATE 0.12 % MT SOLN: 15.0000 mL | Freq: Once | OROMUCOSAL | Status: AC

## 2024-03-01 MED ORDER — POVIDONE-IODINE 10 % EX SWAB
2.0000 | Freq: Once | CUTANEOUS | Status: AC
  Administered 2024-03-01: 2 via TOPICAL

## 2024-03-01 MED ORDER — SIMETHICONE 80 MG PO CHEW
80.0000 mg | CHEWABLE_TABLET | Freq: Four times a day (QID) | ORAL | Status: DC | PRN
  Administered 2024-03-02: 80 mg via ORAL
  Filled 2024-03-01: qty 1

## 2024-03-01 MED ORDER — SUGAMMADEX SODIUM 200 MG/2ML IV SOLN
INTRAVENOUS | Status: DC | PRN
  Administered 2024-03-01: 150 mg via INTRAVENOUS
  Administered 2024-03-01: 50 mg via INTRAVENOUS

## 2024-03-01 MED ORDER — MIDAZOLAM HCL (PF) 2 MG/2ML IJ SOLN: 2.0000 mg | Freq: Once | INTRAMUSCULAR | Status: AC

## 2024-03-01 MED ORDER — LACTATED RINGERS IV SOLN: INTRAVENOUS | Status: DC

## 2024-03-01 MED ORDER — ORAL CARE MOUTH RINSE: 15.0000 mL | Freq: Once | OROMUCOSAL | Status: AC

## 2024-03-01 MED ORDER — AMISULPRIDE (ANTIEMETIC) 5 MG/2ML IV SOLN
10.0000 mg | Freq: Once | INTRAVENOUS | Status: DC | PRN
Start: 2024-03-01 — End: 2024-03-01

## 2024-03-01 MED ORDER — ALBUMIN HUMAN 5 % IV SOLN: INTRAVENOUS | Status: DC | PRN

## 2024-03-01 MED ORDER — SCOPOLAMINE 1 MG/3DAYS TD PT72
MEDICATED_PATCH | TRANSDERMAL | Status: DC | PRN
  Administered 2024-03-01: 1 via TRANSDERMAL

## 2024-03-01 MED ORDER — FENTANYL CITRATE (PF) 100 MCG/2ML IJ SOLN: 50.0000 ug | Freq: Once | INTRAMUSCULAR | Status: AC

## 2024-03-01 MED ORDER — OXYCODONE HCL 5 MG PO TABS: 5.0000 mg | ORAL_TABLET | Freq: Once | ORAL | Status: DC | PRN

## 2024-03-01 MED ORDER — CEFAZOLIN SODIUM-DEXTROSE 2-4 GM/100ML-% IV SOLN
2.0000 g | INTRAVENOUS | Status: AC
  Administered 2024-03-01: 2 g via INTRAVENOUS

## 2024-03-01 MED ORDER — MIDAZOLAM HCL 2 MG/2ML IJ SOLN
INTRAMUSCULAR | Status: AC
  Filled 2024-03-01: qty 2

## 2024-03-01 MED ORDER — CEFAZOLIN SODIUM-DEXTROSE 2-4 GM/100ML-% IV SOLN
INTRAVENOUS | Status: AC
  Filled 2024-03-01: qty 100

## 2024-03-01 MED ORDER — LIDOCAINE 2% (20 MG/ML) 5 ML SYRINGE
INTRAMUSCULAR | Status: DC | PRN
  Administered 2024-03-01: 100 mg via INTRAVENOUS

## 2024-03-01 MED ORDER — 0.9 % SODIUM CHLORIDE (POUR BTL) OPTIME
TOPICAL | Status: DC | PRN
  Administered 2024-03-01: 1000 mL

## 2024-03-01 MED ORDER — VASOPRESSIN 20 UNIT/ML IV SOLN
INTRAVENOUS | Status: AC
  Filled 2024-03-01: qty 1

## 2024-03-01 MED ORDER — HYDROMORPHONE HCL 1 MG/ML IJ SOLN
INTRAMUSCULAR | Status: DC | PRN
  Administered 2024-03-01: .5 mg via INTRAVENOUS

## 2024-03-01 MED ORDER — HYDROMORPHONE HCL 1 MG/ML IJ SOLN
0.2500 mg | INTRAMUSCULAR | Status: DC | PRN
  Administered 2024-03-01 (×3): 0.5 mg via INTRAVENOUS

## 2024-03-01 MED ORDER — HYDROMORPHONE HCL 1 MG/ML IJ SOLN
INTRAMUSCULAR | Status: AC
  Filled 2024-03-01: qty 0.5

## 2024-03-01 MED ORDER — HYDROMORPHONE HCL 1 MG/ML IJ SOLN
INTRAMUSCULAR | Status: AC
  Filled 2024-03-01: qty 1

## 2024-03-01 MED ORDER — KETOROLAC TROMETHAMINE 30 MG/ML IJ SOLN
30.0000 mg | Freq: Four times a day (QID) | INTRAMUSCULAR | Status: DC
  Administered 2024-03-01 – 2024-03-02 (×3): 30 mg via INTRAVENOUS
  Filled 2024-03-01 (×3): qty 1

## 2024-03-01 MED ORDER — ONDANSETRON HCL 4 MG/2ML IJ SOLN
INTRAMUSCULAR | Status: AC
  Filled 2024-03-01: qty 2

## 2024-03-01 MED ORDER — ONDANSETRON HCL 4 MG/2ML IJ SOLN
INTRAMUSCULAR | Status: DC | PRN
Start: 2024-03-01 — End: 2024-03-01
  Administered 2024-03-01: 4 mg via INTRAVENOUS

## 2024-03-01 MED ORDER — FENTANYL CITRATE (PF) 100 MCG/2ML IJ SOLN
INTRAMUSCULAR | Status: AC
  Administered 2024-03-01: 50 ug via INTRAVENOUS
  Filled 2024-03-01: qty 2

## 2024-03-01 MED ORDER — CHLORHEXIDINE GLUCONATE 0.12 % MT SOLN
OROMUCOSAL | Status: AC
  Administered 2024-03-01: 15 mL via OROMUCOSAL
  Filled 2024-03-01: qty 15

## 2024-03-01 MED ORDER — SODIUM CHLORIDE (PF) 0.9 % IJ SOLN
INTRAMUSCULAR | Status: AC
  Filled 2024-03-01: qty 50

## 2024-03-01 MED ORDER — PROPOFOL 10 MG/ML IV BOLUS
INTRAVENOUS | Status: DC | PRN
  Administered 2024-03-01: 200 mg via INTRAVENOUS

## 2024-03-01 MED ORDER — ONDANSETRON HCL 4 MG/2ML IJ SOLN: 4.0000 mg | Freq: Four times a day (QID) | INTRAMUSCULAR | Status: DC | PRN

## 2024-03-01 MED ORDER — MENTHOL 3 MG MT LOZG: 1.0000 | LOZENGE | OROMUCOSAL | Status: DC | PRN

## 2024-03-01 MED ORDER — SCOPOLAMINE 1 MG/3DAYS TD PT72
MEDICATED_PATCH | TRANSDERMAL | Status: AC
Start: 2024-03-01 — End: 2024-03-01
  Filled 2024-03-01: qty 1

## 2024-03-01 MED ORDER — DEXAMETHASONE SOD PHOSPHATE PF 10 MG/ML IJ SOLN
INTRAMUSCULAR | Status: DC | PRN
  Administered 2024-03-01: 5 mg via INTRAVENOUS

## 2024-03-01 MED ORDER — ACETAMINOPHEN 325 MG PO TABS
650.0000 mg | ORAL_TABLET | ORAL | Status: DC | PRN
  Administered 2024-03-01: 650 mg via ORAL
  Filled 2024-03-01: qty 2

## 2024-03-01 MED ORDER — ONDANSETRON HCL 4 MG PO TABS: 4.0000 mg | ORAL_TABLET | Freq: Four times a day (QID) | ORAL | Status: DC | PRN

## 2024-03-01 MED ORDER — MIDAZOLAM HCL 2 MG/2ML IJ SOLN
INTRAMUSCULAR | Status: AC
  Administered 2024-03-01: 2 mg via INTRAVENOUS
  Filled 2024-03-01: qty 2

## 2024-03-01 MED ORDER — SERTRALINE HCL 50 MG PO TABS
100.0000 mg | ORAL_TABLET | Freq: Every day | ORAL | Status: DC
  Administered 2024-03-02: 100 mg via ORAL
  Filled 2024-03-01: qty 2

## 2024-03-01 MED ORDER — BUPIVACAINE HCL (PF) 0.25 % IJ SOLN
INTRAMUSCULAR | Status: DC | PRN
  Administered 2024-03-01 (×2): 30 mL via PERINEURAL

## 2024-03-01 MED ORDER — VASOPRESSIN 20 UNIT/ML IV SOLN
INTRAVENOUS | Status: DC | PRN
  Administered 2024-03-01: 50 mL via INTRAMUSCULAR

## 2024-03-01 MED ORDER — OXYCODONE HCL 5 MG PO TABS
5.0000 mg | ORAL_TABLET | Freq: Four times a day (QID) | ORAL | Status: DC | PRN
  Administered 2024-03-01 – 2024-03-02 (×2): 5 mg via ORAL
  Filled 2024-03-01 (×2): qty 1

## 2024-03-01 MED ORDER — ROCURONIUM BROMIDE 10 MG/ML (PF) SYRINGE
PREFILLED_SYRINGE | INTRAVENOUS | Status: DC | PRN
  Administered 2024-03-01: 30 mg via INTRAVENOUS
  Administered 2024-03-01: 60 mg via INTRAVENOUS

## 2024-03-01 SURGICAL SUPPLY — 39 items
BARRIER ADHS 3X4 INTERCEED (GAUZE/BANDAGES/DRESSINGS) IMPLANT
BENZOIN TINCTURE PRP APPL 2/3 (GAUZE/BANDAGES/DRESSINGS) ×1 IMPLANT
CLSR STERI-STRIP ANTIMIC 1/2X4 (GAUZE/BANDAGES/DRESSINGS) ×1 IMPLANT
CONT SPEC PATH 64OZ SNAP LID (MISCELLANEOUS) ×1 IMPLANT
COVER MAYO STAND STRL (DRAPES) ×1 IMPLANT
DRAPE CESAREAN BIRTH W POUCH (DRAPES) ×1 IMPLANT
DRAPE WARM FLUID 44X44 (DRAPES) ×1 IMPLANT
DRSG OPSITE POSTOP 4X10 (GAUZE/BANDAGES/DRESSINGS) ×1 IMPLANT
DURAPREP 26ML APPLICATOR (WOUND CARE) ×1 IMPLANT
ELECT NDL TIP 2.8 STRL (NEEDLE) ×1 IMPLANT
ELECT NEEDLE TIP 2.8 STRL (NEEDLE) ×1 IMPLANT
GAUZE 4X4 16PLY ~~LOC~~+RFID DBL (SPONGE) IMPLANT
GAUZE PAD ABD 8X10 STRL (GAUZE/BANDAGES/DRESSINGS) IMPLANT
GLOVE BIO SURGEON STRL SZ7 (GLOVE) ×1 IMPLANT
GLOVE BIOGEL PI IND STRL 7.0 (GLOVE) ×2 IMPLANT
GOWN STRL REUS W/ TWL LRG LVL3 (GOWN DISPOSABLE) ×3 IMPLANT
HEMOSTAT SURGICEL 2X14 (HEMOSTASIS) IMPLANT
KIT TURNOVER KIT B (KITS) ×1 IMPLANT
MANIFOLD NEPTUNE II (INSTRUMENTS) ×1 IMPLANT
NDL HYPO 22X1.5 SAFETY MO (MISCELLANEOUS) ×1 IMPLANT
NEEDLE HYPO 22X1.5 SAFETY MO (MISCELLANEOUS) ×1 IMPLANT
PACK ABDOMINAL GYN (CUSTOM PROCEDURE TRAY) ×1 IMPLANT
PAD ARMBOARD POSITIONER FOAM (MISCELLANEOUS) ×1 IMPLANT
PAD OB MATERNITY 11 LF (PERSONAL CARE ITEMS) ×1 IMPLANT
RTRCTR C-SECT PINK 25CM LRG (MISCELLANEOUS) IMPLANT
SOLN 0.9% NACL POUR BTL 1000ML (IV SOLUTION) ×2 IMPLANT
SPIKE FLUID TRANSFER (MISCELLANEOUS) ×1 IMPLANT
SPONGE T-LAP 18X18 ~~LOC~~+RFID (SPONGE) IMPLANT
SUT MON AB 2-0 CT1 27 (SUTURE) ×1 IMPLANT
SUT MON AB 2-0 SH 27 (SUTURE) IMPLANT
SUT MON AB 2-0 SH27 (SUTURE) ×1 IMPLANT
SUT MON AB 3-0 SH27 (SUTURE) ×1 IMPLANT
SUT VIC AB 0 CT1 18XCR BRD8 (SUTURE) ×1 IMPLANT
SUT VIC AB 0 CT1 27XBRD ANBCTR (SUTURE) ×2 IMPLANT
SUT VIC AB 2-0 CT1 TAPERPNT 27 (SUTURE) IMPLANT
SUT VIC AB 4-0 KS 27 (SUTURE) ×1 IMPLANT
SYR CONTROL 10ML LL (SYRINGE) ×1 IMPLANT
TOWEL GREEN STERILE FF (TOWEL DISPOSABLE) ×2 IMPLANT
TRAY FOLEY W/BAG SLVR 14FR (SET/KITS/TRAYS/PACK) ×1 IMPLANT

## 2024-03-01 NOTE — Anesthesia Postprocedure Evaluation (Signed)
 Anesthesia Post Note  Patient: Krista Leach  Procedure(s) Performed: MYOMECTOMY, ABDOMINAL APPROACH (Abdomen)     Patient location during evaluation: PACU Anesthesia Type: General Level of consciousness: awake Pain management: pain level controlled Vital Signs Assessment: post-procedure vital signs reviewed and stable Respiratory status: spontaneous breathing, nonlabored ventilation and respiratory function stable Cardiovascular status: blood pressure returned to baseline and stable Postop Assessment: no apparent nausea or vomiting Anesthetic complications: no   No notable events documented.  Last Vitals:  Vitals:   03/01/24 1545 03/01/24 1600  BP: (!) 146/89 122/74  Pulse: 91 89  Resp: 18 20  Temp:    SpO2: 100% 100%    Last Pain:  Vitals:   03/01/24 1600  TempSrc:   PainSc: Asleep                 Delon Aisha Arch

## 2024-03-01 NOTE — Anesthesia Procedure Notes (Addendum)
 Procedure Name: Intubation Date/Time: 03/01/2024 12:48 PM  Performed by: Lockie Flesher, CRNAPre-anesthesia Checklist: Patient identified, Emergency Drugs available, Suction available and Patient being monitored Patient Re-evaluated:Patient Re-evaluated prior to induction Oxygen Delivery Method: Circle System Utilized Preoxygenation: Pre-oxygenation with 100% oxygen Induction Type: IV induction Ventilation: Mask ventilation without difficulty and Oral airway inserted - appropriate to patient size Laryngoscope Size: Cleotilde and 2 Grade View: Grade II Tube type: Oral Tube size: 7.0 mm Number of attempts: 1 Airway Equipment and Method: Stylet and Oral airway Placement Confirmation: ETT inserted through vocal cords under direct vision, positive ETCO2 and breath sounds checked- equal and bilateral Secured at: 22 cm Tube secured with: Tape Dental Injury: Teeth and Oropharynx as per pre-operative assessment

## 2024-03-01 NOTE — Transfer of Care (Signed)
 Immediate Anesthesia Transfer of Care Note  Patient: Krista Leach  Procedure(s) Performed: MYOMECTOMY, ABDOMINAL APPROACH (Abdomen)  Patient Location: PACU  Anesthesia Type:General  Level of Consciousness: awake, alert , and oriented  Airway & Oxygen Therapy: Patient Spontanous Breathing and Patient connected to nasal cannula oxygen  Post-op Assessment: Report given to RN and Post -op Vital signs reviewed and stable  Post vital signs: Reviewed and stable  Last Vitals:  Vitals Value Taken Time  BP 129/59 03/01/24 15:30  Temp    Pulse 89 03/01/24 15:32  Resp 23 03/01/24 15:32  SpO2 100 % 03/01/24 15:32  Vitals shown include unfiled device data.  Last Pain:  Vitals:   03/01/24 1122  TempSrc: Oral         Complications: No notable events documented.

## 2024-03-01 NOTE — H&P (Signed)
 Krista Leach is an 33 y.o. female here for abdominal myomectomy for large myoma,, increasing abdominal girth and pain and pressure.  CT on 01/21/24 in ED for pain.  1. No acute abnormality in the abdomen. 2. Enlarged fibroid uterus with a large pedunculated fibroid arising from the right fundus measuring 12.5 x 9.1 x 10.3 cm, extending into the right upper quadrant, and a large subserosal fibroid in the left uterine body measuring 11.0 x 9.9 x 11.4 cm. 3. Mild gallbladder distention. Gallbladder wall thickness is upper limits of normal at 3 mm. No pericholecystic fluid or gallstones identified. S/p Lap chole 02/08/24   Pertinent Gynecological History: Menses: flow is moderate regular menses hx Contraception: continuous OCs  DES exposure: denies Blood transfusions: none Sexually transmitted diseases: no past history Last pap:  normal hx  OB History: G1, P0010 TAB hx   No LMP recorded (lmp unknown). (Menstrual status: Oral contraceptives).    Past Medical History:  Diagnosis Date   Anxiety    Medical history non-contributory    No know PMH to date 05/02/15    Past Surgical History:  Procedure Laterality Date   COLONOSCOPY N/A 05/25/2015   DOQ:YZFNMMYNPID/WNMFJO ILEUM AND COLON   LAPAROSCOPIC CHOLECYSTECTOMY  02/08/2024   PILONIDAL CYST DRAINAGE     Dec 2020   WISDOM TOOTH EXTRACTION      Family History  Problem Relation Age of Onset   Colon cancer Neg Hx    Inflammatory bowel disease Neg Hx    Hypertension Mother    Diabetes Mother     Social History:  reports that she has never smoked. She has never used smokeless tobacco. She reports current alcohol use. She reports that she does not use drugs.  Allergies: No Known Allergies  Medications Prior to Admission  Medication Sig Dispense Refill Last Dose/Taking   Norethindrone-Ethinyl Estradiol-Fe Biphas (LO LOESTRIN FE) 1 MG-10 MCG / 10 MCG tablet Take 1 tablet by mouth daily.   Past Week   oxybutynin (DITROPAN-XL) 10  MG 24 hr tablet Take 10 mg by mouth at bedtime.   02/29/2024   sertraline (ZOLOFT) 100 MG tablet Take 100 mg by mouth daily.   02/29/2024   ondansetron  (ZOFRAN ) 4 MG tablet Take 1 tablet (4 mg total) by mouth every 8 (eight) hours as needed. 30 tablet 1    oxyCODONE  (ROXICODONE ) 5 MG immediate release tablet Take 1 tablet (5 mg total) by mouth every 4 (four) hours as needed for severe pain (pain score 7-10) or breakthrough pain. 10 tablet 0    semaglutide-weight management (WEGOVY) 0.5 MG/0.5ML SOAJ SQ injection Inject 0.5 mg into the skin once a week.   More than a month    Review of Systems  Blood pressure (!) 136/90, pulse 67, temperature 97.9 F (36.6 C), temperature source Oral, resp. rate 16, height 5' 2 (1.575 m), weight 87.1 kg, SpO2 96%. Physical Exam Physical exam:  A&O x 3, no acute distress. Pleasant HEENT neg, no thyromegaly Lungs CTA bilat CV RRR, S1S2 normal Abdo soft, non tender, non acute Extr no edema/ tenderness Pelvic  cx normal small. Large fibroid uterus above umbilicus   Results for orders placed or performed during the hospital encounter of 03/01/24 (from the past 24 hours)  Type and screen     Status: None (Preliminary result)   Collection Time: 03/01/24 11:30 AM  Result Value Ref Range   ABO/RH(D) PENDING    Antibody Screen PENDING    Sample Expiration  03/04/2024,2359 Performed at [facility], [address], [place], [place] [address]   CBC     Status: Abnormal   Collection Time: 03/01/24 11:30 AM  Result Value Ref Range   WBC 5.9 4.0 - 10.5 K/uL   RBC 3.86 (L) 3.87 - 5.11 MIL/uL   Hemoglobin 12.1 12.0 - 15.0 g/dL   HCT 62.3 63.9 - 53.9 %   MCV 97.4 80.0 - 100.0 fL   MCH 31.3 26.0 - 34.0 pg   MCHC 32.2 30.0 - 36.0 g/dL   RDW 88.0 88.4 - 84.4 %   Platelets 399 150 - 400 K/uL   nRBC 0.0 0.0 - 0.2 %  ABO/Rh     Status: None (Preliminary result)   Collection Time: 03/01/24 11:35 AM  Result Value Ref Range   ABO/RH(D) PENDING    Pregnancy, urine POC     Status: None   Collection Time: 03/01/24 11:41 AM  Result Value Ref Range   Preg Test, Ur NEGATIVE NEGATIVE    Assessment/Plan: 33 yo female with very large two myomas filling up mid abdomen and causing pain and pressure Here for abdominal myomectomy  Risks/complications of surgery reviewed incl infection, bleeding, damage to internal organs including bladder, bowels, ureters, blood vessels, other risks from anesthesia, VTE and delayed complications of any surgery, complications in future surgery reviewed.    Risk of bleeding and needed for transfusion, auto transfusion from cell saver accepted as well as autologous blood.    Krista Leach R Krista Leach 03/01/2024, 12:14 PM

## 2024-03-01 NOTE — Anesthesia Preprocedure Evaluation (Addendum)
 Anesthesia Evaluation  Patient identified by MRN, date of birth, ID band Patient awake    Reviewed: Allergy & Precautions, NPO status , Patient's Chart, lab work & pertinent test results  History of Anesthesia Complications Negative for: history of anesthetic complications  Airway Mallampati: III  TM Distance: >3 FB Neck ROM: Full   Comment: Previous grade II view with MAC 3, easy mask Dental  (+) Dental Advisory Given   Pulmonary neg pulmonary ROS   Pulmonary exam normal breath sounds clear to auscultation       Cardiovascular negative cardio ROS  Rhythm:Regular Rate:Normal     Neuro/Psych   Anxiety     negative neurological ROS     GI/Hepatic negative GI ROS, Neg liver ROS,,,  Endo/Other  negative endocrine ROS    Renal/GU negative Renal ROS     Musculoskeletal   Abdominal  (+) + obese  Peds  Hematology  (+) Blood dyscrasia, anemia Lab Results      Component                Value               Date                      WBC                      8.5                 01/21/2024                HGB                      11.5 (L)            01/21/2024                HCT                      35.1 (L)            01/21/2024                MCV                      98.0                01/21/2024                PLT                      351                 01/21/2024              Anesthesia Other Findings Last Wegovy: >1 month  Reproductive/Obstetrics                              Anesthesia Physical Anesthesia Plan  ASA: 2  Anesthesia Plan: General   Post-op Pain Management: Tylenol  PO (pre-op)*, Toradol  IV (intra-op)* and Regional block*   Induction: Intravenous  PONV Risk Score and Plan: 3 and Ondansetron , Dexamethasone, Scopolamine patch - Pre-op, Treatment may vary due to age or medical condition and Midazolam   Airway Management Planned: Oral ETT  Additional Equipment:   Intra-op  Plan:   Post-operative Plan: Extubation in OR  Informed Consent: I have reviewed the patients History and Physical, chart, labs and discussed the procedure including the risks, benefits and alternatives for the proposed anesthesia with the patient or authorized representative who has indicated his/her understanding and acceptance.     Dental advisory given  Plan Discussed with: CRNA and Anesthesiologist  Anesthesia Plan Comments: (Discussed potential risks of nerve blocks including, but not limited to, infection, bleeding, nerve damage, seizures, pneumothorax, respiratory depression, and potential failure of the block. Alternatives to nerve blocks discussed. All questions answered.  Risks of general anesthesia discussed including, but not limited to, sore throat, hoarse voice, chipped/damaged teeth, injury to vocal cords, nausea and vomiting, allergic reactions, lung infection, heart attack, stroke, and death. All questions answered. )         Anesthesia Quick Evaluation

## 2024-03-01 NOTE — Anesthesia Procedure Notes (Signed)
 Anesthesia Regional Block: TAP block   Pre-Anesthetic Checklist: , timeout performed,  Correct Patient, Correct Site, Correct Laterality,  Correct Procedure, Correct Position, site marked,  Risks and benefits discussed,  Surgical consent,  Pre-op evaluation,  At surgeon's request and post-op pain management  Laterality: Left and Right  Prep: chloraprep       Needles:  Injection technique: Single-shot  Needle Type: Echogenic Stimulator Needle     Needle Length: 9cm  Needle Gauge: 21     Additional Needles:   Procedures:,,,, ultrasound used (permanent image in chart),,    Narrative:  Start time: 03/01/2024 12:27 PM End time: 03/01/2024 12:35 PM Injection made incrementally with aspirations every 5 mL.  Performed by: Personally  Anesthesiologist: Peggye Delon Brunswick, MD  Additional Notes: Discussed risks and benefits of nerve block including, but not limited to, prolonged and/or permanent nerve injury involving sensory and/or motor function. Monitors were applied and a time-out was performed. The nerve and associated structures were visualized under ultrasound guidance. After negative aspiration, local anesthetic was slowly injected around the nerve. There was no evidence of high pressure during the procedure. There were no paresthesias. VSS remained stable and the patient tolerated the procedure well.

## 2024-03-01 NOTE — Op Note (Signed)
 03/01/2024  PRE-OPERATIVE DIAGNOSIS:  Uterine Fibroids   POST-OPERATIVE DIAGNOSIS: Uterine Fibroids ( specimen weight 1140 gm)  PROCEDURE:  Abdominal Myomectomy   SURGEON: Robbi JONELLE Render, MD  ASSISTANT:  RNFA Farrel Rattler   ANESTHESIA:  General endotracheal  EBL: 50 cc  IVF: LR 1200 cc and 250 cc Albumin  Urine output: urine in foley  375 cc  BLOOD ADMINISTERED:  none needed   DRAINS: Urinary Catheter (Foley)   LOCAL MEDICATIONS USED:  none due to TAP block   SPECIMEN:  Uterine fibroids, two large ones about 10-12 cm and and three smaller about  3 cm  DISPOSITION OF SPECIMEN:  PATHOLOGY  COUNTS:  YES  PATIENT DISPOSITION:  PACU - stable.    Delay start of Pharmacological VTE agent (>24hrs) due to surgical blood loss or risk of bleeding: yes  PROCEDURE:   Indication:  Symptomatic uterine fibroids with abdominal mass, pain for myomectomy.  Risks and complications of surgery including infection, bleeding, damage to internal organs and other including but not limited to surgery related problems including pneumonia, VTE reviewed. Possible blood transfusion, use of cell saver, agreed. Informed written consent was obtained.   Patient had TAP block in pre-op area by Dr Dasie.  She was brought to the operating room with IV running. She received 2 gm Ancef. Underwent general anesthesia without difficulty and was given dorsal supine position, prepped and draped in sterile fashion. Foley catheter was placed. Exam under anesthesia noted uterus at the umbilicus but higher on the right, it was mobile. Pfannenstiel incision was made with scalpel and carried down to the underlying fascia with Bovie with excellent hemostasis.  Fascia incised and extended laterally. Fascia grasped with Kocher's and underlying rectus muscles were dissected down. Rectus muscles were separated in midline. Posterior rectus sheath and posterior peritoneum was grasped with mosquitoes and peritoneal entry made.   Exam noted - a very large myoma just under the incision and further assessment noted uterus and adnexa shifted to the right and superior to this myoma with another large myoma in the body of the uterus.. further assessment felt that the lower myoma was from lower segment anterior to left lateral broad ligament area. Fallopian tubes and both ovaries could not be seen well but were palpated to be above the lower myoma and round ligaments assessed.  Utero-vesical fold of peritoneum incised and extended laterally and bladder was pushed further away with blunt dissection. Due to this new found location of myomas and not having a pedunculated myoma as suspected on CT, decision was made to arrange for cell saver.  Anterior wall of serosa and lower myoma infiltrated with 30 cc of dilute Vasopressin (20 units in 50 cc saline). Anterior wall incised with needle tip cautery, about 8-10 cm size vertical incision made. Serosa and myometrium dissected away from myoma while lifting myoma with a towel clamp for traction by mostly blunt some sharp dissection. Dissection was quite blood less and cautery used as needed to control bleeding. Myoma was about 10 cm and entirely enucleated and handed off.  There were no other myomas accessible from this area. Bleeding in dead space controlled with 3 layers of stitches using 0-vicryl pop-off sutures,first figure of eight sutures deep in cavity and the 2 other layers to close dead space. Serosa closed with 3-0 Monocryl in base-ball stitch fashion.   Now I was able to exteriorize uterus with large central fundal myoma out of the incision. Both tubes, ovaries, round ligaments assessed. Anatomy noted. A  large or multiple fundal myomas suspected but not involving adnexa. 20 cc of dilute vasopressin injected in the fundal area in 3 areas. A vertical trans-fundal incision made with needle tip cautery. Serosa and myometrium incised about 10 cm again across the top.  Myoma reached. Plane  assessed and myoma enucleated by mostly blunt some sharp dissection. It was a large single myoma. Specimen handed off. There was a 2 cm myoma close to base of myoma bed, it was enucleated with minimal bleeding. There was a 3 cm posterior right myoma, there were pushed towards myoma bed, incised on top with cautery and grasped myoma with towel clamp and removed. Myoma bed was closed in 3 layers, first two with 0-vicryl in figure of eight, then imbricating layer followed by 3-0 Monocryl to close serosa with baseball stitch. Hemostasis noted. There was a smaller was anterior right pedunculated myoma at round ligament insertion. The stalk was cut with cautery and based stitched off. Two large incisions appeared hemostatic. Pelvic cavity and peritoneal gutters suctioned. Interceed placed on large incisions. Surgicel also placed on fundal incision before Interceed placed.   Peritoneal edges grasped and peritoneum closed with 2-0 Vicryl. Fascia sutured with 0 Vicryl. Skin approximated with 4-0 Vicryl in subcuticular fashion.  Steristrips, Honeycomb and pressure dressing placed.   All  Instruments/ lap/ sponges counts were correct x2. No complications.  Dr Barbette was the surgeon for entire case.    --Robbi Barbette MD  [facility]

## 2024-03-02 ENCOUNTER — Encounter (HOSPITAL_COMMUNITY): Payer: Self-pay | Admitting: Obstetrics & Gynecology

## 2024-03-02 ENCOUNTER — Other Ambulatory Visit (HOSPITAL_COMMUNITY): Payer: Self-pay

## 2024-03-02 DIAGNOSIS — Z9889 Other specified postprocedural states: Secondary | ICD-10-CM

## 2024-03-02 DIAGNOSIS — Z833 Family history of diabetes mellitus: Secondary | ICD-10-CM | POA: Diagnosis not present

## 2024-03-02 DIAGNOSIS — Z79899 Other long term (current) drug therapy: Secondary | ICD-10-CM | POA: Diagnosis not present

## 2024-03-02 DIAGNOSIS — D649 Anemia, unspecified: Secondary | ICD-10-CM | POA: Diagnosis present

## 2024-03-02 DIAGNOSIS — D259 Leiomyoma of uterus, unspecified: Secondary | ICD-10-CM | POA: Diagnosis present

## 2024-03-02 DIAGNOSIS — Z8249 Family history of ischemic heart disease and other diseases of the circulatory system: Secondary | ICD-10-CM | POA: Diagnosis not present

## 2024-03-02 DIAGNOSIS — Z7985 Long-term (current) use of injectable non-insulin antidiabetic drugs: Secondary | ICD-10-CM | POA: Diagnosis not present

## 2024-03-02 MED ORDER — KETOROLAC TROMETHAMINE 30 MG/ML IJ SOLN: 30.0000 mg | Freq: Four times a day (QID) | INTRAMUSCULAR | 0 refills | Status: DC

## 2024-03-02 MED ORDER — IBUPROFEN 600 MG PO TABS
600.0000 mg | ORAL_TABLET | Freq: Four times a day (QID) | ORAL | 0 refills | Status: AC
  Filled 2024-03-02: qty 30, 8d supply, fill #0

## 2024-03-02 MED ORDER — ACETAMINOPHEN 325 MG PO TABS: 650.0000 mg | ORAL_TABLET | ORAL | Status: AC | PRN

## 2024-03-02 MED ORDER — OXYCODONE HCL 5 MG PO TABS
5.0000 mg | ORAL_TABLET | Freq: Four times a day (QID) | ORAL | 0 refills | Status: AC | PRN
  Filled 2024-03-02: qty 20, 5d supply, fill #0

## 2024-03-02 NOTE — Progress Notes (Signed)
 1 Day Post-Op Procedure(s) (LRB): MYOMECTOMY, ABDOMINAL APPROACH (N/A)  Subjective: Patient reports gas pain. Tolerating reg diet, ambulating and voiding.  No vag bleeding  No dizziness. .    Objective: I have reviewed patient's vital signs, intake and output, medications, and labs. BP 118/62 (BP Location: Left Arm)   Pulse 75   Temp 98.5 F (36.9 C) (Oral)   Resp 17   Ht 5' 2 (1.575 m)   Wt 87.1 kg   LMP  (LMP Unknown)   SpO2 97%   BMI 35.12 kg/m   General: alert, cooperative, and appears stated age Resp: clear to auscultation bilaterally Cardio: regular rate and rhythm, S1, S2 normal, no murmur, click, rub or gallop GI: soft, non-tender; bowel sounds normal; no masses,  no organomegaly and incision: bloody drainage present Extremities: extremities normal, atraumatic, no cyanosis or edema and Homans sign is negative, no sign of DVT Change honeycomb dressing     Latest Ref Rng & Units 03/01/2024    5:26 PM 03/01/2024   11:30 AM 01/21/2024    4:36 AM  CBC  WBC 4.0 - 10.5 K/uL 11.9  5.9  8.5   Hemoglobin 12.0 - 15.0 g/dL 88.5  87.8  88.4   Hematocrit 36.0 - 46.0 % 34.4  37.6  35.1   Platelets 150 - 400 K/uL 362  399  351       Assessment: s/p Procedure(s): MYOMECTOMY, ABDOMINAL APPROACH (N/A): stable, progressing well, and tolerating diet Meeting goals.  OR findings and images dw pt   Plan: Discharge home Post op care and warning ss call parameters dw pt incl after-hr phone info  F/up dr Barbette 2 wks    LOS: 1 day    Robbi JONELLE Barbette, MD 03/02/2024, 8:19 AM

## 2024-03-02 NOTE — Discharge Summary (Signed)
 Physician Discharge Summary  Patient ID: Krista Leach MRN: [id number] DOB/AGE: November 17, 1990 33 y.o.  Admit date: 03/01/2024 Discharge date: 03/02/2024  Admission Diagnoses:  Discharge Diagnoses:  Principal Problem:   Leiomyoma of uterus, unspecified Active Problems:   S/P myomectomy   Discharged Condition: good  Hospital Course: uncomplicated   Discharge Exam: Blood pressure 118/62, pulse 75, temperature 98.5 F (36.9 C), temperature source Oral, resp. rate 17, height 5' 2 (1.575 m), weight 87.1 kg, SpO2 97%. Exam nl. See post op rounds   Disposition: Discharge disposition: 01-Home or Self Care       Discharge Instructions     Call MD for:  difficulty breathing, headache or visual disturbances   Complete by: As directed    Call MD for:  hives   Complete by: As directed    Call MD for:  persistant dizziness or light-headedness   Complete by: As directed    Call MD for:  persistant nausea and vomiting   Complete by: As directed    Call MD for:  redness, tenderness, or signs of infection (pain, swelling, redness, odor or green/yellow discharge around incision site)   Complete by: As directed    Call MD for:  severe uncontrolled pain   Complete by: As directed    Call MD for:  temperature >100.4   Complete by: As directed    Diet - low sodium heart healthy   Complete by: As directed    Discharge wound care:   Complete by: As directed    Remove honeycomb dressing on 03/06/24 at home   Driving Restrictions   Complete by: As directed    2-3 weeks as needed   Increase activity slowly   Complete by: As directed    Lifting restrictions   Complete by: As directed    No more than 10 pounds only for 6 weeks   Sexual Activity Restrictions   Complete by: As directed    6 weeks      Allergies as of 03/02/2024   No Known Allergies      Medication List     STOP taking these medications    oxybutynin 10 MG 24 hr tablet Commonly known as: DITROPAN-XL        TAKE these medications    acetaminophen  325 MG tablet Commonly known as: TYLENOL  Take 2 tablets (650 mg total) by mouth every 4 (four) hours as needed for mild pain (pain score 1-3) or fever (temperature > 101.5.).   ibuprofen  600 MG tablet Commonly known as: ADVIL  Take 1 tablet (600 mg total) by mouth every 6 (six) hours.   Lo Loestrin Fe 1 MG-10 MCG / 10 MCG tablet Generic drug: Norethindrone-Ethinyl Estradiol-Fe Biphas Take 1 tablet by mouth daily.   oxyCODONE  5 MG immediate release tablet Commonly known as: Oxy IR/ROXICODONE  Take 1 tablet (5 mg total) by mouth every 6 (six) hours as needed for up to 5 days for moderate pain (pain score 4-6). What changed:  when to take this reasons to take this   sertraline 100 MG tablet Commonly known as: ZOLOFT Take 100 mg by mouth daily.   Wegovy 0.5 MG/0.5ML Soaj SQ injection Generic drug: semaglutide-weight management Inject 0.5 mg into the skin once a week.               Discharge Care Instructions  (From admission, onward)           Start     Ordered   03/02/24 0000  Discharge wound  care:       Comments: Remove honeycomb dressing on 03/06/24 at home   03/02/24 0825             Signed: Robbi JONELLE Render 03/02/2024, 8:26 AM

## 2024-03-03 LAB — SURGICAL PATHOLOGY

## 2024-03-03 MED FILL — Sodium Chloride IV Soln 0.9%: INTRAVENOUS | Qty: 2000 | Status: AC

## 2024-03-03 MED FILL — Heparin Sodium (Porcine) Inj 1000 Unit/ML: INTRAMUSCULAR | Qty: 30 | Status: AC
